# Patient Record
Sex: Female | Born: 1935 | Race: White | Hispanic: No | Marital: Married | Smoking: Never smoker
Health system: Southern US, Community
[De-identification: ages and names within clinical notes are randomized; demographics above are authoritative.]

## PROBLEM LIST (undated history)

## (undated) DIAGNOSIS — R32 Unspecified urinary incontinence: Secondary | ICD-10-CM

## (undated) DIAGNOSIS — IMO0001 Reserved for inherently not codable concepts without codable children: Secondary | ICD-10-CM

## (undated) DIAGNOSIS — I70229 Atherosclerosis of native arteries of extremities with rest pain, unspecified extremity: Secondary | ICD-10-CM

## (undated) DIAGNOSIS — R102 Pelvic and perineal pain: Secondary | ICD-10-CM

## (undated) DIAGNOSIS — S81802A Unspecified open wound, left lower leg, initial encounter: Secondary | ICD-10-CM

## (undated) DIAGNOSIS — M79605 Pain in left leg: Secondary | ICD-10-CM

## (undated) DIAGNOSIS — R1011 Right upper quadrant pain: Secondary | ICD-10-CM

## (undated) DIAGNOSIS — I10 Essential (primary) hypertension: Secondary | ICD-10-CM

## (undated) DIAGNOSIS — R030 Elevated blood-pressure reading, without diagnosis of hypertension: Secondary | ICD-10-CM

## (undated) DIAGNOSIS — B3789 Other sites of candidiasis: Secondary | ICD-10-CM

## (undated) DIAGNOSIS — N898 Other specified noninflammatory disorders of vagina: Secondary | ICD-10-CM

## (undated) DIAGNOSIS — S0990XA Unspecified injury of head, initial encounter: Secondary | ICD-10-CM

## (undated) DIAGNOSIS — E669 Obesity, unspecified: Secondary | ICD-10-CM

## (undated) DIAGNOSIS — Z9181 History of falling: Secondary | ICD-10-CM

## (undated) DIAGNOSIS — N952 Postmenopausal atrophic vaginitis: Secondary | ICD-10-CM

## (undated) DIAGNOSIS — M7120 Synovial cyst of popliteal space [Baker], unspecified knee: Secondary | ICD-10-CM

## (undated) DIAGNOSIS — M5136 Other intervertebral disc degeneration, lumbar region: Secondary | ICD-10-CM

## (undated) DIAGNOSIS — R42 Dizziness and giddiness: Secondary | ICD-10-CM

## (undated) DIAGNOSIS — N390 Urinary tract infection, site not specified: Secondary | ICD-10-CM

## (undated) DIAGNOSIS — R350 Frequency of micturition: Secondary | ICD-10-CM

## (undated) HISTORY — DX: Unspecified injury of head, initial encounter: S09.90XA

## (undated) HISTORY — DX: Other sites of candidiasis: B37.89

## (undated) HISTORY — DX: Right upper quadrant pain: R10.11

## (undated) HISTORY — PX: ABDOMINAL HYSTERECTOMY: SHX81

## (undated) HISTORY — DX: Dizziness and giddiness: R42

## (undated) HISTORY — DX: Atherosclerosis of native arteries of extremities with rest pain, unspecified extremity: I70.229

## (undated) HISTORY — DX: Other specified noninflammatory disorders of vagina: N89.8

## (undated) HISTORY — PX: APPENDECTOMY: SHX54

## (undated) HISTORY — DX: Pelvic and perineal pain: R10.2

## (undated) HISTORY — DX: Synovial cyst of popliteal space (Baker), unspecified knee: M71.20

## (undated) HISTORY — DX: Pain in left leg: M79.605

## (undated) HISTORY — PX: RECTOCELE REPAIR: SHX761

## (undated) HISTORY — DX: Elevated blood-pressure reading, without diagnosis of hypertension: R03.0

## (undated) HISTORY — DX: Other intervertebral disc degeneration, lumbar region: M51.36

## (undated) HISTORY — DX: Unspecified open wound, left lower leg, initial encounter: S81.802A

## (undated) HISTORY — DX: Obesity, unspecified: E66.9

## (undated) HISTORY — DX: Frequency of micturition: R35.0

## (undated) HISTORY — DX: Postmenopausal atrophic vaginitis: N95.2

## (undated) HISTORY — DX: Urinary tract infection, site not specified: N39.0

## (undated) HISTORY — PX: TONSILLECTOMY: SUR1361

## (undated) HISTORY — DX: History of falling: Z91.81

## (undated) HISTORY — PX: OTHER SURGICAL HISTORY: SHX169

## (undated) HISTORY — DX: Unspecified urinary incontinence: R32

## (undated) HISTORY — DX: Reserved for inherently not codable concepts without codable children: IMO0001

---

## 2004-08-05 ENCOUNTER — Ambulatory Visit: Payer: Self-pay | Admitting: Gastroenterology

## 2004-08-10 ENCOUNTER — Ambulatory Visit: Payer: Self-pay | Admitting: Gastroenterology

## 2006-11-20 ENCOUNTER — Ambulatory Visit: Payer: Self-pay | Admitting: Gastroenterology

## 2007-08-05 ENCOUNTER — Ambulatory Visit: Payer: Self-pay | Admitting: Specialist

## 2008-05-19 ENCOUNTER — Emergency Department: Payer: Self-pay | Admitting: Emergency Medicine

## 2008-09-15 ENCOUNTER — Ambulatory Visit: Payer: Self-pay | Admitting: Ophthalmology

## 2008-09-23 ENCOUNTER — Ambulatory Visit: Payer: Self-pay | Admitting: Ophthalmology

## 2008-10-26 ENCOUNTER — Ambulatory Visit: Payer: Self-pay | Admitting: Ophthalmology

## 2011-11-11 ENCOUNTER — Observation Stay: Payer: Self-pay | Admitting: Specialist

## 2011-11-11 LAB — TROPONIN I: Troponin-I: <0.02 ng/mL

## 2011-11-11 LAB — CBC
HCT: 41.5 % (ref 35.0–47.0)
HGB: 14 g/dL (ref 12.0–16.0)
MCHC: 33.7 g/dL (ref 32.0–36.0)
MCV: 92 fL (ref 80–100)
RBC: 4.49 10*6/uL (ref 3.80–5.20)
WBC: 7.4 10*3/uL (ref 3.6–11.0)

## 2011-11-11 LAB — PROTIME-INR
INR: 0.9
Prothrombin Time: 13 secs (ref 11.5–14.7)

## 2011-11-11 LAB — URINALYSIS, COMPLETE
Bilirubin,UR: NEGATIVE
Blood: NEGATIVE
Glucose,UR: NEGATIVE mg/dL (ref 0–75)
Protein: NEGATIVE
RBC,UR: 2 /HPF (ref 0–5)
Squamous Epithelial: 1
Transitional Epi: <1
WBC UR: 9 /HPF (ref 0–5)

## 2011-11-11 LAB — COMPREHENSIVE METABOLIC PANEL
Alkaline Phosphatase: 91 U/L (ref 50–136)
BUN: 12 mg/dL (ref 7–18)
Chloride: 105 mmol/L (ref 98–107)
Co2: 26 mmol/L (ref 21–32)
Creatinine: 1 mg/dL (ref 0.60–1.30)
EGFR (African American): 59 — ABNORMAL LOW
EGFR (Non-African Amer.): 51 — ABNORMAL LOW
SGOT(AST): 36 U/L (ref 15–37)
SGPT (ALT): 24 U/L (ref 12–78)
Total Protein: 7.2 g/dL (ref 6.4–8.2)

## 2011-11-12 LAB — LIPID PANEL
Cholesterol: 209 mg/dL — ABNORMAL HIGH (ref 0–200)
Ldl Cholesterol, Calc: 146 mg/dL — ABNORMAL HIGH (ref 0–100)

## 2012-11-12 ENCOUNTER — Emergency Department: Payer: Self-pay

## 2012-11-12 LAB — URINALYSIS, COMPLETE
Bilirubin,UR: NEGATIVE
Blood: NEGATIVE
Ketone: NEGATIVE
Leukocyte Esterase: NEGATIVE
Ph: 6 (ref 4.5–8.0)
RBC,UR: 2 /HPF (ref 0–5)
Squamous Epithelial: 1

## 2012-11-12 LAB — COMPREHENSIVE METABOLIC PANEL
Albumin: 4 g/dL (ref 3.4–5.0)
Alkaline Phosphatase: 94 U/L (ref 50–136)
BUN: 15 mg/dL (ref 7–18)
Bilirubin,Total: 0.3 mg/dL (ref 0.2–1.0)
Calcium, Total: 9 mg/dL (ref 8.5–10.1)
Chloride: 105 mmol/L (ref 98–107)
Creatinine: 1.26 mg/dL (ref 0.60–1.30)
EGFR (African American): 44 — ABNORMAL LOW
EGFR (Non-African Amer.): 38 — ABNORMAL LOW
Osmolality: 274 (ref 275–301)
Potassium: 3.7 mmol/L (ref 3.5–5.1)
SGOT(AST): 34 U/L (ref 15–37)
SGPT (ALT): 23 U/L (ref 12–78)
Sodium: 137 mmol/L (ref 136–145)
Total Protein: 7.3 g/dL (ref 6.4–8.2)

## 2012-11-12 LAB — CBC
HCT: 41.7 % (ref 35.0–47.0)
HGB: 14.1 g/dL (ref 12.0–16.0)
MCHC: 33.8 g/dL (ref 32.0–36.0)
MCV: 93 fL (ref 80–100)
RBC: 4.5 10*6/uL (ref 3.80–5.20)

## 2012-11-12 LAB — LIPASE, BLOOD: Lipase: 180 U/L (ref 73–393)

## 2013-02-21 ENCOUNTER — Ambulatory Visit: Payer: Self-pay | Admitting: Family Medicine

## 2013-04-24 ENCOUNTER — Ambulatory Visit: Payer: Self-pay | Admitting: Family Medicine

## 2013-04-25 ENCOUNTER — Emergency Department: Payer: Self-pay

## 2013-04-25 LAB — URINALYSIS, COMPLETE
Bacteria: NONE SEEN
Bilirubin,UR: NEGATIVE
Blood: NEGATIVE
Glucose,UR: NEGATIVE mg/dL (ref 0–75)
KETONE: NEGATIVE
Leukocyte Esterase: NEGATIVE
Nitrite: NEGATIVE
PH: 5 (ref 4.5–8.0)
PROTEIN: NEGATIVE
SPECIFIC GRAVITY: 1.027 (ref 1.003–1.030)
Squamous Epithelial: 1
WBC UR: 2 /HPF (ref 0–5)

## 2013-04-25 LAB — CBC WITH DIFFERENTIAL/PLATELET
BASOS PCT: 1 %
Basophil #: 0 10*3/uL (ref 0.0–0.1)
Eosinophil #: 0 10*3/uL (ref 0.0–0.7)
Eosinophil %: 0.1 %
HCT: 40.2 % (ref 35.0–47.0)
HGB: 13.7 g/dL (ref 12.0–16.0)
LYMPHS ABS: 1 10*3/uL (ref 1.0–3.6)
Lymphocyte %: 27.8 %
MCH: 31.9 pg (ref 26.0–34.0)
MCHC: 34 g/dL (ref 32.0–36.0)
MCV: 94 fL (ref 80–100)
MONO ABS: 0.8 x10 3/mm (ref 0.2–0.9)
Monocyte %: 23 %
NEUTROS PCT: 48.1 %
Neutrophil #: 1.7 10*3/uL (ref 1.4–6.5)
Platelet: 183 10*3/uL (ref 150–440)
RBC: 4.3 10*6/uL (ref 3.80–5.20)
RDW: 13.2 % (ref 11.5–14.5)
WBC: 3.6 10*3/uL (ref 3.6–11.0)

## 2013-04-25 LAB — LIPASE, BLOOD: Lipase: 277 U/L (ref 73–393)

## 2013-04-25 LAB — COMPREHENSIVE METABOLIC PANEL
ALBUMIN: 3.6 g/dL (ref 3.4–5.0)
ALK PHOS: 71 U/L
ANION GAP: 4 — AB (ref 7–16)
BUN: 20 mg/dL — ABNORMAL HIGH (ref 7–18)
Bilirubin,Total: 0.3 mg/dL (ref 0.2–1.0)
CHLORIDE: 102 mmol/L (ref 98–107)
CREATININE: 1.02 mg/dL (ref 0.60–1.30)
Calcium, Total: 8.8 mg/dL (ref 8.5–10.1)
Co2: 29 mmol/L (ref 21–32)
EGFR (Non-African Amer.): 49 — ABNORMAL LOW
GFR CALC AF AMER: 57 — AB
Glucose: 87 mg/dL (ref 65–99)
Osmolality: 272 (ref 275–301)
Potassium: 3.8 mmol/L (ref 3.5–5.1)
SGOT(AST): 24 U/L (ref 15–37)
SGPT (ALT): 22 U/L (ref 12–78)
SODIUM: 135 mmol/L — AB (ref 136–145)
TOTAL PROTEIN: 7 g/dL (ref 6.4–8.2)

## 2013-04-25 LAB — TROPONIN I

## 2013-09-02 ENCOUNTER — Encounter: Payer: Self-pay | Admitting: General Surgery

## 2013-09-02 ENCOUNTER — Ambulatory Visit: Payer: Self-pay | Admitting: Family Medicine

## 2013-09-18 ENCOUNTER — Ambulatory Visit: Payer: Self-pay | Admitting: General Surgery

## 2014-06-02 NOTE — Discharge Summary (Signed)
PATIENT NAME:  Tiffany Jenkins, Tiffany Jenkins MR#:  [id number] DATE OF BIRTH:  24-Mar-1923  DATE OF ADMISSION:  11/11/2011 DATE OF DISCHARGE:  11/12/2011  For a detailed note, please take a look at the history and physical done on admission by Dr. Imogene Burnhen.   DIAGNOSES AT DISCHARGE:  1. Syncope secondary to orthostatic hypotension.  2. Undiagnosed hypertension.  3. Hyperlipidemia.   DIET: The patient is being discharged on a low sodium, low fat diet.   ACTIVITY: As tolerated.   FOLLOW-UP: Follow-up with Dr. Lacie ScottsNiemeyer in the next 1 to 2 weeks.   DISCHARGE MEDICATIONS: Norvasc 5 mg daily.   PERTINENT STUDIES DONE DURING THE HOSPITAL COURSE: CT scan of the head done without contrast on admission showing no acute intracranial process.   An ultrasound of the carotids showed no evidence of any hemodynamically significant carotid stenosis.   A two-dimensional echocardiogram done showing normal LV function with EF 55% with mild mitral regurgitation and mild to moderate aortic regurgitation.   BRIEF HOSPITAL COURSE: This is an 81 year old female with no past medical history who presented to the hospital with a syncopal episode.  1. Syncope. The most likely cause of the patient'Jenkins syncope was likely orthostatic hypotension. She was noted to be positive for orthostasis in the Emergency Room. She has a history of undiagnosed hypertension and she is currently not taking any medications. She was observed overnight on telemetry and did not have any evidence of arrhythmia. Her CT of the head was negative. Her carotid duplex and echocardiogram also are essentially normal. She has been ambulated today, is asymptomatic, and has no evidence of orthostasis. She is, therefore, being discharged home with close follow-up with her primary care physician.  2. Hypertension. The patient probably has stage I hypertension undiagnosed and is currently not on any medications. She has had systolic blood pressures as high as 140'Jenkins to 150'Jenkins. I  am discharging her on low dose Norvasc as stated.  3. Hyperlipidemia. As per the patient and the family, she does have a history of hyperlipidemia. She had a lipid profile done at her primary care physician'Jenkins office about 3 to 4 weeks ago, although she wants to try lifestyle modifications before starting any pharmacologic treatment. This can be further followed up by her primary care physician.   TIME SPENT: 35 minutes.   ____________________________ Rolly PancakeVivek J. Cherlynn KaiserSainani, MD vjs:drc D: 11/12/2011 12:54:59 ET T: 11/13/2011 10:04:35 ET JOB#: [id number]  cc: Rolly PancakeVivek J. Cherlynn KaiserSainani, MD, <Dictator> Meindert A. Lacie ScottsNiemeyer, MD Houston SirenVIVEK J Athina Fahey MD ELECTRONICALLY SIGNED 11/14/2011 8:52

## 2014-06-02 NOTE — H&P (Signed)
PATIENT NAME:  Tiffany Jenkins, Tiffany Jenkins MR#:  [id number] DATE OF BIRTH:  [birth year]  DATE OF ADMISSION:  11/11/2011  PRIMARY CARE PHYSICIAN: Dr. Bethena MidgetMead   REFERRING PHYSICIAN: Dr. Clemens Catholicagsdale   CHIEF COMPLAINT: Passed out this afternoon.   HISTORY OF PRESENT ILLNESS: The patient is an 81 year old Caucasian female with a past history of UTI who presented to the ED with the above chief complaint. The patient is alert, awake, oriented in no acute distress. She said she passed out about several minutes at 3 p.m. while doing yard work. She denies any headache, dizziness, incontinence. No seizure. No slurred speech or dysphagia. The patient fell on the ground and hurt her right buttock. She denies any other symptoms.   PAST MEDICAL HISTORY: Urinary tract infection.   PAST SURGICAL HISTORY: None.   SOCIAL HISTORY: No smoking, drinking, or illicit drugs.   FAMILY HISTORY: None.   ALLERGIES: Codeine and sulfa drugs.  REVIEW OF SYSTEMS: CONSTITUTIONAL: The patient denies any fever, chills. No headache or dizziness. No weakness or weight loss. EYES: No double vision or blurred vision. ENT: No epistaxis, postnasal drip, or slurred speech. No dysphagia. CARDIOVASCULAR: No chest pain, palpitation, orthopnea, nocturnal dyspnea. No leg edema. PULMONARY: No cough, sputum, shortness of breath, or hemoptysis. GI: No abdominal pain, nausea, vomiting, or diarrhea. GU: No dysuria, hematuria, or incontinence. SKIN: No rash or jaundice. HEMATOLOGY: No easy bruising or bleeding. NEUROLOGY: Positive for syncope and loss of consciousness but no seizure.    PHYSICAL EXAMINATION:   VITAL SIGNS: Blood pressure 166/67, pulse 85, respirations 22, oxygen saturation 96% on room air.   GENERAL: The patient is alert, awake, oriented in no acute distress.   HEENT: Pupils are equal and reactive to light and accommodation. Moist oral mucosa. Clear oropharynx.   NECK: Supple. No JVD or carotid bruits. No lymphadenopathy. No thyromegaly.     CARDIOVASCULAR: S1, S2 regular rate, rhythm. No murmurs or gallops.   PULMONARY: Bilateral air entry. No wheezing or rales. No use of accessory muscles to breathe.   ABDOMEN: Soft. No distention or tenderness. No organomegaly. Bowel sounds present.   EXTREMITIES: No edema, clubbing, or cyanosis. No calf tenderness. Strong bilateral pedal pulses.   SKIN: No rash or jaundice.   NEUROLOGIC: Alert and oriented x3. No focal deficit. Power 5/5. Sensation intact. Deep tendon reflexes mute.   LABORATORY DATA: CBC normal. Urinalysis showed WBC 9, RBC 2. CK 292. CK-MB 4.8. Glucose 97, BUN 12, creatinine 1.0. Electrolytes normal. INR 0.9. Troponin less than 0.02. TSH 4.31.   Hip right complete x-ray no acute injury.   Pelvis x-ray no acute injury.   CAT scan of head no acute intracranial process.   EKG showed normal sinus rhythm at 80 beats per minute.   IMPRESSION:  1. Syncope, unknown etiology.  2. Hypertension.  3. Urinary tract infection.   PLAN OF TREATMENT:  1. The patient will be placed for observation.  2. We will get telemonitor, echocardiogram, and carotid duplex.  3. For mild urinary tract infection, we will start Rocephin. 4. The patient may be discharged to tomorrow if tests negative.  5. GI and DVT prophylaxis.   Discussed the patient'Jenkins situation and plan of treatment with the patient, the patient'Jenkins husband, and daughter.   TIME SPENT: About 50 minutes.   ____________________________ Shaune PollackQing Besse Miron, MD qc:drc D: 11/11/2011 20:54:02 ET T: 11/12/2011 08:34:18 ET JOB#: [id number]  cc: Shaune PollackQing Iza Preston, MD, <Dictator> Mickel Crowobert J. Mead, MD Shaune PollackQING Petula Rotolo MD ELECTRONICALLY SIGNED 11/13/2011 14:35

## 2014-06-03 ENCOUNTER — Ambulatory Visit
Admit: 2014-06-03 | Disposition: A | Discharge status: Home / Self Care | Payer: Self-pay | Attending: Urology | Admitting: Urology

## 2014-09-25 ENCOUNTER — Other Ambulatory Visit: Payer: Self-pay | Admitting: Family Medicine

## 2014-10-01 ENCOUNTER — Telehealth: Payer: Self-pay | Admitting: Family Medicine

## 2014-10-01 MED ORDER — HYDROCHLOROTHIAZIDE 12.5 MG PO TABS: 12.5000 mg | ORAL_TABLET | Freq: Every day | ORAL | Status: DC

## 2014-10-01 NOTE — Telephone Encounter (Signed)
Medication has been refilled and sent to [facility]

## 2015-01-04 ENCOUNTER — Telehealth: Payer: Self-pay | Admitting: Urology

## 2015-01-04 NOTE — Telephone Encounter (Signed)
Pt has been on Macrobid and has 9 refills left.  Pt and her daughter feel that this medicine is expensive and the pharmacist told them that there is a lesser priced drug available, named Ditropan, that she can take twice a day compared to the Macrobid once a day.  Please give the patient and her daughter a call at [phone].  Insurance does not cover Macrobid.

## 2015-01-04 NOTE — Telephone Encounter (Signed)
Please advise 

## 2015-01-05 NOTE — Telephone Encounter (Signed)
Macrobid is an antibiotic, so Ditropan is not a substitute for this medication.  This doesn't make sense.

## 2015-01-11 NOTE — Telephone Encounter (Signed)
No answer

## 2015-01-13 NOTE — Telephone Encounter (Signed)
Spoke with pt in reference to medications. Pt stated that her delivery driver from the pharmacy is who told her about the medications. Explained to pt those are 2 different medications and ditropan will not help with infections. Pt voiced understanding and stated she recently had an infection and had to see her PCP for help. Reinforced with pt the need to come see us due to that being our speciality. Pt stated she has a hard time getting into our clinic. Made pt aware that problem has been handled and should she develop another UTI to call us first and well try to get her in. Pt voiced understanding.

## 2015-06-21 ENCOUNTER — Encounter: Payer: Self-pay | Admitting: *Deleted

## 2015-06-23 ENCOUNTER — Ambulatory Visit (INDEPENDENT_AMBULATORY_CARE_PROVIDER_SITE_OTHER): Payer: Medicare Other | Admitting: Urology

## 2015-06-23 ENCOUNTER — Encounter: Payer: Self-pay | Admitting: Urology

## 2015-06-23 ENCOUNTER — Telehealth: Payer: Self-pay | Admitting: Urology

## 2015-06-23 VITALS — BP 154/76 | HR 93 | Ht 65.0 in | Wt 143.9 lb

## 2015-06-23 DIAGNOSIS — N952 Postmenopausal atrophic vaginitis: Secondary | ICD-10-CM | POA: Diagnosis not present

## 2015-06-23 DIAGNOSIS — R32 Unspecified urinary incontinence: Secondary | ICD-10-CM

## 2015-06-23 DIAGNOSIS — N390 Urinary tract infection, site not specified: Secondary | ICD-10-CM | POA: Diagnosis not present

## 2015-06-23 LAB — URINALYSIS, COMPLETE
Bilirubin, UA: NEGATIVE
Glucose, UA: NEGATIVE
NITRITE UA: POSITIVE — AB
Protein, UA: NEGATIVE
RBC, UA: NEGATIVE
SPEC GRAV UA: 1.02 (ref 1.005–1.030)
Urobilinogen, Ur: 0.2 mg/dL (ref 0.2–1.0)
pH, UA: 5.5 (ref 5.0–7.5)

## 2015-06-23 LAB — MICROSCOPIC EXAMINATION: RBC MICROSCOPIC, UA: NONE SEEN /HPF (ref 0–?)

## 2015-06-23 MED ORDER — ESTROGENS, CONJUGATED 0.625 MG/GM VA CREA: 1.0000 | TOPICAL_CREAM | Freq: Every day | VAGINAL | Status: DC

## 2015-06-23 MED ORDER — ESTRADIOL 0.1 MG/GM VA CREA: TOPICAL_CREAM | VAGINAL | Status: DC

## 2015-06-23 MED ORDER — NITROFURANTOIN MONOHYD MACRO 100 MG PO CAPS: 100.0000 mg | ORAL_CAPSULE | Freq: Two times a day (BID) | ORAL | Status: DC

## 2015-06-23 NOTE — Progress Notes (Signed)
In and Out Catheterization  Patient is present today for a I & O catheterization due to urinary tract infection. Patient was cleaned and prepped in a sterile fashion with betadine and Lidocaine 2% jelly was instilled into the urethra.  A 14FR cath was inserted no complications were noted , 250ml of urine return was noted, urine was yellow in color. A clean urine sample was collected for clean catch urinalysis. Bladder was drained and catheter was removed with out difficulty.    Preformed by: Dallas Schimkeamona Williams CMA

## 2015-06-23 NOTE — Telephone Encounter (Signed)
Would you call Dr. Fredna DowMasoud's office and see if they have documented infections over the last year?

## 2015-06-23 NOTE — Progress Notes (Signed)
06/23/2015 2:26 PM   Tiffany Jenkins 05/20/1923 [id number]  Referring provider: No referring provider defined for this encounter.  Chief Complaint  Patient presents with  . Urinary Tract Infection    patient thinks she has an uti    HPI: Patient is a 82 year old Caucasian female who presents today stating that she has a possible urinary tract infection.  She is also due for her annual visit.  She states that she was on a Z-Pak for 2 weeks for upper respiratory tract infection. She then started experiencing a vaginal yeast infection. She was prescribed a vaginal yeast cream by her primary care physician, Dr. Juel BurrowMasoud for 7 days.  She is still experiencing vaginal irritation and itching. She denies any vaginal discharge.  She is also having frequency of urination, urgency, dysuria and leakage of urine.  She is currently taking to Ditropan XL 15 mg daily for the urinary leakage and she states that it provides her significant reduction in incontinent episodes.  She is not had gross hematuria or suprapubic pain. She denies fevers, chills, nausea or vomiting.  Her cath UA specimen is nitrate positive.  I will be sending it for culture.  She had been on Myrbetriq 25 mg daily in the past, but it was not affordable.  She has been unable to come to our office when she is experiencing symptoms of urinary tract infections due to her inability to drive and dependent on transportation.  She does live close to her primary care physician's office and has been seeing them for her urinary tract infections.  She is unsure how many infection she has had over the last year.  She states that when she has symptoms of a UTI, the office will call in an antibiotic for her.  PMH: Past Medical History  Diagnosis Date  . Urinary tract infection   . Head injury   . DDD (degenerative disc disease), lumbar   . Urinary frequency   . Abdominal pain, right upper quadrant   . Vaginal itching   . Pain of  left lower extremity   . Candidiasis of breast   . At risk for falls   . Elevated BP   . Dizziness   . Atherosclerosis of native arteries of extremities with rest pain, unspecified extremity (HCC)   . Synovial cyst of popliteal space   . Wound of left leg   . Urinary incontinence   . Vaginal atrophy   . Obesity   . Pelvic pain in female     Surgical History: Past Surgical History  Procedure Laterality Date  . Appendectomy    . Rectocele repair    . Abdominal hysterectomy    . Tonsillectomy    . Bladder track      x 2    Home Medications:    Medication List       This list is accurate as of: 06/23/15  2:26 PM.  Always use your most recent med list.               amLODipine 5 MG tablet  Commonly known as:  NORVASC  Reported on 06/23/2015     azithromycin 250 MG tablet  Commonly known as:  ZITHROMAX  Reported on 06/23/2015     fluconazole 100 MG tablet  Commonly known as:  DIFLUCAN     fluorouracil 5 % cream  Commonly known as:  EFUDEX     hydrochlorothiazide 12.5 MG tablet  Commonly known as:  HYDRODIURIL  Take 1 tablet (12.5 mg total) by mouth daily.     oxybutynin 15 MG 24 hr tablet  Commonly known as:  DITROPAN XL        Allergies:  Allergies  Allergen Reactions  . Codeine   . Sulfa Antibiotics     Family History: Family History  Problem Relation Age of Onset  . Prostate cancer Son   . Kidney disease Mother   . Breast cancer Sister     Social History:  reports that she has never smoked. She does not have any smokeless tobacco history on file. She reports that she does not drink alcohol or use illicit drugs.  ROS: UROLOGY Frequent Urination?: Yes Hard to postpone urination?: Yes Burning/pain with urination?: Yes Get up at night to urinate?: No Leakage of urine?: Yes Urine stream starts and stops?: No Trouble starting stream?: No Do you have to strain to urinate?: No Blood in urine?: No Urinary tract infection?: Yes Sexually  transmitted disease?: No Injury to kidneys or bladder?: No Painful intercourse?: No Weak stream?: No Currently pregnant?: No Vaginal bleeding?: No Last menstrual period?: n  Gastrointestinal Nausea?: No Vomiting?: No Indigestion/heartburn?: No Diarrhea?: No Constipation?: No  Constitutional Fever: No Night sweats?: No Weight loss?: No Fatigue?: No  Skin Skin rash/lesions?: Yes Itching?: Yes  Eyes Blurred vision?: No Double vision?: No  Ears/Nose/Throat Sore throat?: No Sinus problems?: No  Hematologic/Lymphatic Swollen glands?: Yes Easy bruising?: Yes  Cardiovascular Leg swelling?: Yes Chest pain?: No  Respiratory Cough?: No Shortness of breath?: No  Endocrine Excessive thirst?: No  Musculoskeletal Back pain?: No Joint pain?: No  Neurological Headaches?: No Dizziness?: No  Psychologic Depression?: No Anxiety?: No  Physical Exam: BP 154/76 mmHg  Pulse 93  Ht 5\' 5"  (1.651 m)  Wt 143 lb 14.4 oz (65.273 kg)  BMI 23.95 kg/m2  Constitutional: Well nourished. Alert and oriented, No acute distress. HEENT: [place] AT, moist mucus membranes. Trachea midline, no masses. Cardiovascular: No clubbing, cyanosis, or edema. Respiratory: Normal respiratory effort, no increased work of breathing. GI: Abdomen is soft, non tender, non distended, no abdominal masses. Liver and spleen not palpable.  No hernias appreciated.  Stool sample for occult testing is not indicated.   GU: No CVA tenderness.  No bladder fullness or masses.  Atrophic external genitalia, normal pubic hair distribution, no lesions.  Normal urethral meatus, no lesions, no prolapse, no discharge.   Urethral caruncle is noted.   No bladder fullness, tenderness or masses. Normal vagina mucosa, good estrogen effect, no discharge, no lesions, good pelvic support, Grade II cystocele is noted.  No rectocele is noted.  Cervix, uterus and adnexa are surgically absent.  Anus and perineum are without rashes or  lesions.    Skin: No rashes, bruises or suspicious lesions. Lymph: No cervical or inguinal adenopathy. Neurologic: Grossly intact, no focal deficits, moving all 4 extremities. Psychiatric: Normal mood and affect.  Laboratory Data: Lab Results  Component Value Date   WBC 3.6 04/25/2013   HGB 13.7 04/25/2013   HCT 40.2 04/25/2013   MCV 94 04/25/2013   PLT 183 04/25/2013    Lab Results  Component Value Date   CREATININE 1.02 04/25/2013    Lab Results  Component Value Date   TSH 4.31 11/11/2011       Component Value Date/Time   CHOL 209* 11/12/2011 0411   HDL 36* 11/12/2011 0411   VLDL 27 11/12/2011 0411   LDLCALC 146* 11/12/2011 0411    Lab Results  Component Value  Date   AST 24 04/25/2013   Lab Results  Component Value Date   ALT 22 04/25/2013    Urinalysis Results for orders placed or performed in visit on 06/23/15  CULTURE, URINE COMPREHENSIVE  Result Value Ref Range   Urine Culture, Comprehensive Final report (A)    Result 1 Escherichia coli (A)    ANTIMICROBIAL SUSCEPTIBILITY Comment   Microscopic Examination  Result Value Ref Range   WBC, UA 11-30 (A) 0 -  5 /hpf   RBC, UA None seen 0 -  2 /hpf   Epithelial Cells (non renal) 0-10 0 - 10 /hpf   Bacteria, UA Many (A) None seen/Few  Urinalysis, Complete  Result Value Ref Range   Specific Gravity, UA 1.020 1.005 - 1.030   pH, UA 5.5 5.0 - 7.5   Color, UA Yellow Yellow   Appearance Ur Cloudy (A) Clear   Leukocytes, UA 1+ (A) Negative   Protein, UA Negative Negative/Trace   Glucose, UA Negative Negative   Ketones, UA Trace (A) Negative   RBC, UA Negative Negative   Bilirubin, UA Negative Negative   Urobilinogen, Ur 0.2 0.2 - 1.0 mg/dL   Nitrite, UA Positive (A) Negative   Microscopic Examination See below:      Assessment & Plan:    1. Recurrent UTI:   I do not have any recent documented urinary tract infections. We will contact Dr. Hollice Gong office for those records. Her UA today is suspicious  for a urinary tract infection, so I sent it for culture. I have empirically started her on Macrobid 100 mg twice daily for 7 days.  - Urinalysis, Complete - Culture, Urine comprehensive  2. Atrophic vaginitis:   Patient was given a sample of vaginal estrogen cream and instructed to apply 0.5mg  (pea-sized amount)  just inside the vaginal introitus with a finger-tip every night for two weeks and then Monday, Wednesday and Friday nights.  I explained to the patient that vaginally administered estrogen, which causes only a slight increase in the blood estrogen levels, have fewer contraindications and adverse systemic effects that oral HT.  3. Incontinence:   She found Myrbetriq cost prohibitive. She is currently on Ditropan XL 15 mg daily with good effect. She will continue that medication as prescribed by her primary care physician.    Return in about 2 weeks (around 07/07/2015) for UA and exam.  These notes generated with voice recognition software. I apologize for typographical errors.  Michiel Cowboy, PA-C  [facility] [address] [place], [place] [address] [phone]

## 2015-06-25 ENCOUNTER — Telehealth: Payer: Self-pay

## 2015-06-25 DIAGNOSIS — N390 Urinary tract infection, site not specified: Secondary | ICD-10-CM

## 2015-06-25 LAB — CULTURE, URINE COMPREHENSIVE

## 2015-06-25 MED ORDER — NITROFURANTOIN MONOHYD MACRO 100 MG PO CAPS: 100.0000 mg | ORAL_CAPSULE | Freq: Two times a day (BID) | ORAL | Status: DC

## 2015-06-25 NOTE — Telephone Encounter (Signed)
Spoke with pt in reference to +ucx. Made aware to continue macrobid. Pt voiced understanding.

## 2015-06-25 NOTE — Telephone Encounter (Signed)
-----   Message from Harle BattiestShannon A McGowan, PA-C sent at 06/25/2015  3:15 PM EDT ----- Patient does have a positive urine culture. She needs to continue the Macrobid antibiotic. I will see her in 2 weeks.

## 2015-06-27 DIAGNOSIS — R32 Unspecified urinary incontinence: Secondary | ICD-10-CM | POA: Insufficient documentation

## 2015-06-27 DIAGNOSIS — N952 Postmenopausal atrophic vaginitis: Secondary | ICD-10-CM | POA: Insufficient documentation

## 2015-06-27 DIAGNOSIS — N390 Urinary tract infection, site not specified: Secondary | ICD-10-CM | POA: Insufficient documentation

## 2015-06-30 NOTE — Telephone Encounter (Signed)
LMOM for Tiffany Jenkins, Dr. Tilda FrancoMasouds nurse

## 2015-07-01 NOTE — Telephone Encounter (Signed)
Spoke with Morrie SheldonAshley at Dr. Fredna DowMasoud's office. Per Morrie SheldonAshley pt has only been seeing them for a year now and was only dx with a UTI twice. Requested [place] fax all records from the past year. Morrie Sheldonshley stated she would fax them.

## 2015-07-06 ENCOUNTER — Other Ambulatory Visit: Payer: Self-pay | Admitting: Unknown Physician Specialty

## 2015-07-06 DIAGNOSIS — R1310 Dysphagia, unspecified: Secondary | ICD-10-CM

## 2015-07-07 ENCOUNTER — Ambulatory Visit (INDEPENDENT_AMBULATORY_CARE_PROVIDER_SITE_OTHER): Payer: Medicare Other | Admitting: Urology

## 2015-07-07 ENCOUNTER — Encounter: Payer: Self-pay | Admitting: Urology

## 2015-07-07 VITALS — BP 177/74 | HR 73 | Ht 65.0 in | Wt 146.6 lb

## 2015-07-07 DIAGNOSIS — R32 Unspecified urinary incontinence: Secondary | ICD-10-CM | POA: Diagnosis not present

## 2015-07-07 DIAGNOSIS — N390 Urinary tract infection, site not specified: Secondary | ICD-10-CM | POA: Diagnosis not present

## 2015-07-07 DIAGNOSIS — N952 Postmenopausal atrophic vaginitis: Secondary | ICD-10-CM

## 2015-07-07 LAB — URINALYSIS, COMPLETE
Bilirubin, UA: NEGATIVE
GLUCOSE, UA: NEGATIVE
Ketones, UA: NEGATIVE
Leukocytes, UA: NEGATIVE
Nitrite, UA: NEGATIVE
Protein, UA: NEGATIVE
Specific Gravity, UA: 1.01 (ref 1.005–1.030)
UUROB: 0.2 mg/dL (ref 0.2–1.0)
pH, UA: 6.5 (ref 5.0–7.5)

## 2015-07-07 LAB — MICROSCOPIC EXAMINATION: Bacteria, UA: NONE SEEN

## 2015-07-07 NOTE — Progress Notes (Signed)
In and Out Catheterization  Patient is present today for a I & O catheterization due to recheck on uti. Patient was cleaned and prepped in a sterile fashion with betadine and Lidocaine 2% jelly was instilled into the urethra.  A 14FR cath was inserted no complications were noted , 250ml of urine return was noted, urine was clear and yellow in color. A clean urine sample was collected for clean catch urinalysis and culture. Bladder was drained and catheter was removed with out difficulty.    Preformed by: Dallas Schimkeamona Izzabella Besse CMA

## 2015-07-07 NOTE — Progress Notes (Signed)
4:25 PM   Tiffany Jenkins [birth year] [id number]  Referring provider: Corky Downs, MD [address] [place], [place] [address]  Chief Complaint  Patient presents with  . Recurrent UTI    cath specimen follow up    HPI: Patient is a 82 year old Caucasian female with atrophic vaginitis, incontinence and a history of recurrent UTI who presents today for 2 week follow-up.  Background history Patient presented two weeks ago stating that she has a possible urinary tract infection.  She was also due for her annual visit.  She stated that she was on a Z-Pak for 2 weeks for upper respiratory tract infection. She then started experiencing a vaginal yeast infection. She was prescribed a vaginal yeast cream by her primary care physician, Dr. Juel Burrow for 7 days.  She was still experiencing vaginal irritation and itching. She denies any vaginal discharge.  She was also having frequency of urination, urgency, dysuria and leakage of urine.  She is currently taking to Ditropan XL 15 mg daily for the urinary leakage and she states that it provides her significant reduction in incontinent episodes.  Her cath UA specimen is nitrate positive.  Her urine culture was positive for E. Coli.  We prescribed her Macrobid and she completed a seven day course.  She had been on Myrbetriq 25 mg daily in the past, but it was not affordable.  She has been unable to come to our office when she is experiencing symptoms of urinary tract infections due to her inability to drive and dependent on transportation.  She does live close to her primary care physician's office and has been seeing them for her urinary tract infections.  She is unsure how many infection she has had over the last year.  She states that when she has symptoms of a UTI, the office will call in an antibiotic for her.  Today, she is experiencing frequency of urination, dysuria, nocturia and leakage of urine.  She was cathed for an UA today.  Her PVR is  250 mL.  Her UA is unremarkable.    She is using her vaginal estrogen cream.    PMH: Past Medical History  Diagnosis Date  . Urinary tract infection   . Head injury   . DDD (degenerative disc disease), lumbar   . Urinary frequency   . Abdominal pain, right upper quadrant   . Vaginal itching   . Pain of left lower extremity   . Candidiasis of breast   . At risk for falls   . Elevated BP   . Dizziness   . Atherosclerosis of native arteries of extremities with rest pain, unspecified extremity (HCC)   . Synovial cyst of popliteal space   . Wound of left leg   . Urinary incontinence   . Vaginal atrophy   . Obesity   . Pelvic pain in female     Surgical History: Past Surgical History  Procedure Laterality Date  . Appendectomy    . Rectocele repair    . Abdominal hysterectomy    . Tonsillectomy    . Bladder track      x 2    Home Medications:        Medication List       This list is accurate as of: 07/07/15  4:25 PM.  Always use your most recent med list.               amLODipine 5 MG tablet  Commonly known as:  NORVASC  Reported on 07/07/2015     azithromycin 250 MG tablet  Commonly known as:  ZITHROMAX  Reported on 07/07/2015     conjugated estrogens vaginal cream  Commonly known as:  PREMARIN  Place 1 Applicatorful vaginally daily. Apply 0.5mg  (pea-sized amount)  just inside the vaginal introitus with a finger-tip every night for two weeks and then Monday, Wednesday and Friday nights.     estradiol 0.1 MG/GM vaginal cream  Commonly known as:  ESTRACE VAGINAL  Apply 0.5mg  (pea-sized amount)  just inside the vaginal introitus with a finger-tip every night for two weeks and then Monday, Wednesday and Friday nights.     fluconazole 100 MG tablet  Commonly known as:  DIFLUCAN  Reported on 07/07/2015     fluorouracil 5 % cream  Commonly known as:  EFUDEX  Reported on 07/07/2015     hydrochlorothiazide 12.5 MG tablet  Commonly known as:  HYDRODIURIL  Take  1 tablet (12.5 mg total) by mouth daily.     nitrofurantoin (macrocrystal-monohydrate) 100 MG capsule  Commonly known as:  MACROBID  Take 1 capsule (100 mg total) by mouth every 12 (twelve) hours.     oxybutynin 15 MG 24 hr tablet  Commonly known as:  DITROPAN XL  Reported on 07/07/2015        Allergies:  Allergies  Allergen Reactions  . Codeine   . Sulfa Antibiotics     Family History: Family History  Problem Relation Age of Onset  . Prostate cancer Son   . Kidney disease Mother   . Breast cancer Sister     Social History:  reports that she has never smoked. She does not have any smokeless tobacco history on file. She reports that she does not drink alcohol or use illicit drugs.  ROS: UROLOGY Frequent Urination?: Yes Hard to postpone urination?: No Burning/pain with urination?: Yes Get up at night to urinate?: Yes Leakage of urine?: Yes Urine stream starts and stops?: No Trouble starting stream?: No Do you have to strain to urinate?: No Blood in urine?: No Urinary tract infection?: Yes Sexually transmitted disease?: No Injury to kidneys or bladder?: No Painful intercourse?: No Weak stream?: No Currently pregnant?: No Vaginal bleeding?: No Last menstrual period?: n  Gastrointestinal Nausea?: No Vomiting?: No Indigestion/heartburn?: No Diarrhea?: No Constipation?: No  Constitutional Fever: No Night sweats?: No Weight loss?: No Fatigue?: No  Skin Skin rash/lesions?: No Itching?: No  Eyes Blurred vision?: No Double vision?: No  Ears/Nose/Throat Sore throat?: No Sinus problems?: No  Hematologic/Lymphatic Swollen glands?: Yes Easy bruising?: Yes  Cardiovascular Leg swelling?: Yes Chest pain?: No  Respiratory Cough?: No Shortness of breath?: No  Endocrine Excessive thirst?: No  Musculoskeletal Back pain?: No Joint pain?: No  Neurological Headaches?: No Dizziness?: No  Psychologic Depression?: No Anxiety?: No  Physical  Exam: BP 177/74 mmHg  Pulse 73  Ht  (1.651 m)  Wt 146 lb 9.6 oz (66.497 kg)  BMI 24.40 kg/m2  Constitutional: Well nourished. Alert and oriented, No acute distress. HEENT: [place] AT, moist mucus membranes. Trachea midline, no masses. Cardiovascular: No clubbing, cyanosis, or edema. Respiratory: Normal respiratory effort, no increased work of breathing. GI: Abdomen is soft, non tender, non distended, no abdominal masses. Liver and spleen not palpable.  No hernias appreciated.  Stool sample for occult testing is not indicated.   GU: No CVA tenderness.  No bladder fullness or masses.  Atrophic external genitalia, normal pubic hair distribution, no lesions.  Normal urethral meatus, no  lesions, no prolapse, no discharge.   Urethral caruncle is noted.   No bladder fullness, tenderness or masses. Normal vagina mucosa, good estrogen effect, no discharge, no lesions, good pelvic support, Grade II cystocele is noted.  No rectocele is noted.  Cervix, uterus and adnexa are surgically absent.  Anus and perineum are without rashes or lesions.    Skin: No rashes, bruises or suspicious lesions. Lymph: No cervical or inguinal adenopathy. Neurologic: Grossly intact, no focal deficits, moving all 4 extremities. Psychiatric: Normal mood and affect.  Laboratory Data: Lab Results  Component Value Date   WBC 3.6 04/25/2013   HGB 13.7 04/25/2013   HCT 40.2 04/25/2013   MCV 94 04/25/2013   PLT 183 04/25/2013    Lab Results  Component Value Date   CREATININE 1.02 04/25/2013    Lab Results  Component Value Date   TSH 4.31 11/11/2011       Component Value Date/Time   CHOL 209* 11/12/2011 0411   HDL 36* 11/12/2011 0411   VLDL 27 11/12/2011 0411   LDLCALC 146* 11/12/2011 0411    Lab Results  Component Value Date   AST 24 04/25/2013   Lab Results  Component Value Date   ALT 22 04/25/2013    Urinalysis Results for orders placed or performed in visit on 07/07/15  CULTURE, URINE  COMPREHENSIVE  Result Value Ref Range   Urine Culture, Comprehensive Final report    Result 1 Comment   Microscopic Examination  Result Value Ref Range   WBC, UA 0-5 0 -  5 /hpf   RBC, UA 0-2 0 -  2 /hpf   Epithelial Cells (non renal) 0-10 0 - 10 /hpf   Bacteria, UA None seen None seen/Few  Urinalysis, Complete  Result Value Ref Range   Specific Gravity, UA 1.010 1.005 - 1.030   pH, UA 6.5 5.0 - 7.5   Color, UA Yellow Yellow   Appearance Ur Clear Clear   Leukocytes, UA Negative Negative   Protein, UA Negative Negative/Trace   Glucose, UA Negative Negative   Ketones, UA Negative Negative   RBC, UA Trace (A) Negative   Bilirubin, UA Negative Negative   Urobilinogen, Ur 0.2 0.2 - 1.0 mg/dL   Nitrite, UA Negative Negative   Microscopic Examination See below:      Assessment & Plan:    1. Recurrent UTI:   She was treated with a seven day course of Macrobid and is still having irritative voiding symptoms.  Her CATH UA is unremarkable.  I will send it for culture to ensure resolution of the infection.  If it returns negative,  I will schedule the patient for cystoscopy to rule out CIS.    - Urinalysis, Complete - Culture, Urine comprehensive  2. Atrophic vaginitis:   Patient will continue the vaginal estrogen cream, applying it 3 nights weekly. She will follow-up in 3 months for exam and symptom recheck.   3. Incontinence:   She found Myrbetriq cost prohibitive. She is currently on Ditropan XL 15 mg daily with good effect.  She will continue that medication as prescribed by her primary care physician.    Return for cystoscopy to rule out CIS.  These notes generated with voice recognition software. I apologize for typographical errors.  Kealey Kemmer, PA-C

## 2015-07-09 LAB — CULTURE, URINE COMPREHENSIVE

## 2015-07-13 ENCOUNTER — Ambulatory Visit
Admission: RE | Admit: 2015-07-13 | Discharge: 2015-07-13 | Disposition: A | Discharge status: Home / Self Care | Payer: Medicare Other | Source: Ambulatory Visit | Attending: Unknown Physician Specialty | Admitting: Unknown Physician Specialty

## 2015-07-13 ENCOUNTER — Telehealth: Payer: Self-pay

## 2015-07-13 DIAGNOSIS — J398 Other specified diseases of upper respiratory tract: Secondary | ICD-10-CM | POA: Diagnosis not present

## 2015-07-13 DIAGNOSIS — K219 Gastro-esophageal reflux disease without esophagitis: Secondary | ICD-10-CM | POA: Diagnosis not present

## 2015-07-13 DIAGNOSIS — K449 Diaphragmatic hernia without obstruction or gangrene: Secondary | ICD-10-CM | POA: Insufficient documentation

## 2015-07-13 DIAGNOSIS — R1310 Dysphagia, unspecified: Secondary | ICD-10-CM | POA: Insufficient documentation

## 2015-07-13 NOTE — Telephone Encounter (Signed)
-----   Message from Harle BattiestShannon A McGowan, PA-C sent at 07/10/2015  4:53 PM EDT ----- Please notify the patient that their urine culture is negative.

## 2015-07-13 NOTE — Telephone Encounter (Signed)
LMOM-recent labs are negative. 

## 2015-07-19 ENCOUNTER — Other Ambulatory Visit (HOSPITAL_COMMUNITY): Payer: Self-pay | Admitting: Unknown Physician Specialty

## 2015-07-19 ENCOUNTER — Other Ambulatory Visit: Payer: Self-pay | Admitting: Unknown Physician Specialty

## 2015-07-19 DIAGNOSIS — R1310 Dysphagia, unspecified: Secondary | ICD-10-CM

## 2015-07-27 ENCOUNTER — Ambulatory Visit
Admission: RE | Admit: 2015-07-27 | Discharge: 2015-07-27 | Disposition: A | Discharge status: Home / Self Care | Payer: Medicare Other | Source: Ambulatory Visit | Attending: Unknown Physician Specialty | Admitting: Unknown Physician Specialty

## 2015-07-27 DIAGNOSIS — R1310 Dysphagia, unspecified: Secondary | ICD-10-CM

## 2015-07-29 ENCOUNTER — Encounter: Payer: Self-pay | Admitting: *Deleted

## 2015-07-29 ENCOUNTER — Encounter
Admission: RE | Disposition: A | Discharge status: Home / Self Care | Payer: Self-pay | Source: Ambulatory Visit | Attending: Unknown Physician Specialty

## 2015-07-29 ENCOUNTER — Ambulatory Visit: Payer: Medicare Other | Admitting: Anesthesiology

## 2015-07-29 ENCOUNTER — Ambulatory Visit
Admission: RE | Admit: 2015-07-29 | Discharge: 2015-07-29 | Disposition: A | Discharge status: Home / Self Care | Payer: Medicare Other | Source: Ambulatory Visit | Attending: Unknown Physician Specialty | Admitting: Unknown Physician Specialty

## 2015-07-29 ENCOUNTER — Other Ambulatory Visit: Payer: Self-pay | Admitting: Student

## 2015-07-29 DIAGNOSIS — J398 Other specified diseases of upper respiratory tract: Secondary | ICD-10-CM | POA: Insufficient documentation

## 2015-07-29 DIAGNOSIS — I495 Sick sinus syndrome: Secondary | ICD-10-CM | POA: Diagnosis not present

## 2015-07-29 DIAGNOSIS — Z5309 Procedure and treatment not carried out because of other contraindication: Secondary | ICD-10-CM | POA: Insufficient documentation

## 2015-07-29 DIAGNOSIS — E669 Obesity, unspecified: Secondary | ICD-10-CM | POA: Insufficient documentation

## 2015-07-29 DIAGNOSIS — Z841 Family history of disorders of kidney and ureter: Secondary | ICD-10-CM | POA: Diagnosis not present

## 2015-07-29 DIAGNOSIS — M5136 Other intervertebral disc degeneration, lumbar region: Secondary | ICD-10-CM | POA: Insufficient documentation

## 2015-07-29 DIAGNOSIS — Z8042 Family history of malignant neoplasm of prostate: Secondary | ICD-10-CM | POA: Diagnosis not present

## 2015-07-29 DIAGNOSIS — R000 Tachycardia, unspecified: Secondary | ICD-10-CM | POA: Insufficient documentation

## 2015-07-29 DIAGNOSIS — Z90710 Acquired absence of both cervix and uterus: Secondary | ICD-10-CM | POA: Insufficient documentation

## 2015-07-29 DIAGNOSIS — Z6827 Body mass index (BMI) 27.0-27.9, adult: Secondary | ICD-10-CM | POA: Diagnosis not present

## 2015-07-29 DIAGNOSIS — Z882 Allergy status to sulfonamides status: Secondary | ICD-10-CM | POA: Diagnosis not present

## 2015-07-29 DIAGNOSIS — M1990 Unspecified osteoarthritis, unspecified site: Secondary | ICD-10-CM | POA: Insufficient documentation

## 2015-07-29 DIAGNOSIS — R1310 Dysphagia, unspecified: Secondary | ICD-10-CM

## 2015-07-29 DIAGNOSIS — Z885 Allergy status to narcotic agent status: Secondary | ICD-10-CM | POA: Insufficient documentation

## 2015-07-29 DIAGNOSIS — Z803 Family history of malignant neoplasm of breast: Secondary | ICD-10-CM | POA: Diagnosis not present

## 2015-07-29 DIAGNOSIS — Z880 Allergy status to penicillin: Secondary | ICD-10-CM | POA: Diagnosis not present

## 2015-07-29 DIAGNOSIS — I10 Essential (primary) hypertension: Secondary | ICD-10-CM | POA: Insufficient documentation

## 2015-07-29 HISTORY — DX: Essential (primary) hypertension: I10

## 2015-07-29 HISTORY — PX: ESOPHAGOGASTRODUODENOSCOPY (EGD) WITH PROPOFOL: SHX5813

## 2015-07-29 HISTORY — PX: BALLOON DILATION: SHX5330

## 2015-07-29 SURGERY — ESOPHAGOGASTRODUODENOSCOPY (EGD) WITH PROPOFOL
Anesthesia: General

## 2015-07-29 MED ORDER — SODIUM CHLORIDE 0.9 % IV SOLN: INTRAVENOUS | Status: DC

## 2015-07-29 MED ORDER — SODIUM CHLORIDE 0.9 % IV SOLN
INTRAVENOUS | Status: DC
Start: 2015-07-29 — End: 2015-07-29
  Administered 2015-07-29: 1000 mL via INTRAVENOUS

## 2015-07-29 MED ORDER — NITROGLYCERIN 2 % TD OINT
1.0000 [in_us] | TOPICAL_OINTMENT | TRANSDERMAL | Status: AC
  Administered 2015-07-29: 1 [in_us] via TOPICAL
  Filled 2015-07-29: qty 1

## 2015-07-29 MED ORDER — METOPROLOL TARTRATE 5 MG/5ML IV SOLN
INTRAVENOUS | Status: DC | PRN
  Administered 2015-07-29: 5 mg via INTRAVENOUS

## 2015-07-29 NOTE — Consult Note (Signed)
Reason for Consult:chest pain tachycardia Referring Physician: Dr. Noralyn Pick anesthesiologist Dr. Mechele Collin Dr. Leonia Reader Tiffany Jenkins is an 82 y.o. female.  HPI: 46-year-old female presents for EGD because of potential esophageal problems she's on minimal medication was done reasonably well but before the procedure began she was found to be tachycardic with a heart rate of 130 appeared to be regular possibly sinusitis versus flutter. EKG was obtained during the tachycardia. The patient complained of some chest discomfort during the episode. Symptoms improved slowly once her heart rate returned to normal. Patient denies any blackout spells or syncope. She denies any previous recent cardiac history. Cardiology consultation was recommended for preoperative clearance for EGD.  Past Medical History  Diagnosis Date  . Urinary tract infection   . Head injury   . DDD (degenerative disc disease), lumbar   . Urinary frequency   . Abdominal pain, right upper quadrant   . Vaginal itching   . Pain of left lower extremity   . Candidiasis of breast   . At risk for falls   . Elevated BP   . Dizziness   . Atherosclerosis of native arteries of extremities with rest pain, unspecified extremity (HCC)   . Synovial cyst of popliteal space   . Wound of left leg   . Urinary incontinence   . Vaginal atrophy   . Obesity   . Pelvic pain in female   . Hypertension     Past Surgical History  Procedure Laterality Date  . Appendectomy    . Rectocele repair    . Abdominal hysterectomy    . Tonsillectomy    . Bladder track      x 2    Family History  Problem Relation Age of Onset  . Prostate cancer Son   . Kidney disease Mother   . Breast cancer Sister     Social History:  reports that she has never smoked. She does not have any smokeless tobacco history on file. She reports that she does not drink alcohol or use illicit drugs.  Allergies:  Allergies  Allergen Reactions  . Codeine   .  Penicillins Hives  . Sulfa Antibiotics     Medications: I have reviewed the patient's current medications.  No results found for this or any previous visit (from the past 48 hour(s)).  US Thyroid  07/27/2015  CLINICAL DATA:  Dysphagia, tracheal deviation EXAM: THYROID ULTRASOUND TECHNIQUE: Ultrasound examination of the thyroid gland and adjacent soft tissues was performed. COMPARISON:  None. FINDINGS: Right thyroid lobe Measurements: 5.1 x 1.5 x 1.8 cm. Heterogeneous echotexture. 1.2 x 1.1 x 1.1 cm mostly solid nodule, right mid lobe. More inferiorly, a 0.8 cm colloid cyst. Somewhat poorly marginated 1.2 x1.5 x 0.6 cm solid nodule, superior pole. Left thyroid lobe Measurements: 4.5 x 0.9 x 1.5 cm. 0.4 cm colloid cyst, mid lobe. 0.9 x 0.6 x 0.5 cm complex nodule, inferior pole. Isthmus Thickness: 0.2 cm.  No nodules visualized. Lymphadenopathy None visualized. IMPRESSION: 1. Borderline thyromegaly with bilateral nodules. Findings do not meet current consensus criteria for biopsy. Follow-up by clinical exam is recommended. If patient has known risk factors for thyroid carcinoma, consider follow-up ultrasound in 12 months. If patient is clinically hyperthyroid, consider nuclear medicine thyroid uptake and scan. This recommendation follows the consensus statement: Management of Thyroid Nodules Detected as [place]: Society of Radiologists in Ultrasound Consensus Conference Statement. Radiology 2005; [id number]. Electronically Signed   By: Corlis Leak M.D.   On: 07/27/2015 17:55  Review of Systems  Constitutional: Positive for malaise/fatigue.  HENT: Positive for congestion.   Eyes: Negative.   Respiratory: Negative.   Cardiovascular: Positive for chest pain and palpitations.  Gastrointestinal: Positive for heartburn and nausea.  Genitourinary: Negative.   Musculoskeletal: Negative.   Neurological: Positive for weakness.  Psychiatric/Behavioral: Negative.    Blood pressure 150/62, pulse 73,  temperature 97.8 F (36.6 C), temperature source Tympanic, resp. rate 25, height 5\' 1"  (1.549 m), weight 67.132 kg (148 lb), SpO2 99 %. Physical Exam  Constitutional: She is oriented to person, place, and time. She appears well-developed and well-nourished.  HENT:  Head: Normocephalic and atraumatic.  Eyes: Conjunctivae and EOM are normal. Pupils are equal, round, and reactive to light.  Neck: Normal range of motion. Neck supple.  Cardiovascular: Normal rate.   Murmur heard. Respiratory: Effort normal and breath sounds normal.  GI: Soft. Bowel sounds are normal.  Musculoskeletal: Normal range of motion.  Neurological: She is alert and oriented to person, place, and time. She has normal reflexes.  Skin: Skin is warm.  Psychiatric: She has a normal mood and affect.    Assessment/Plan: Chest pain possible angina Tachycardia hypertension Sick sinus syndrome Pre-op EGD murmur . PLAN Consider echocardiogram [facility] Start low-dose beta blocker metoprolol 12.5 once a day Continue low-dose aspirin therapy Continue HCTZ for hypertension Have the patient follow-up in the office History of blood pressure and heart rate improved on metoprolol) the patient to have EGD Do not recommend invasive evaluation at this point Have the patient follow-up in the office if heart rate and blood pressure okay will call the patient and proceed with the procedure  CALLWOOD,DWAYNE D. 07/29/2015, 1:07 PM

## 2015-07-29 NOTE — Transfer of Care (Signed)
Immediate Anesthesia Transfer of Care Note  Patient: Tiffany Jenkins  Procedure(s) Performed: Procedure(s): ESOPHAGOGASTRODUODENOSCOPY (EGD) WITH PROPOFOL (N/A) BALLOON DILATION (N/A)  Patient Location: PACU  Anesthesia Type:General  Level of Consciousness: awake and sedated  Airway & Oxygen Therapy: Patient Spontanous Breathing and Patient connected to nasal cannula oxygen  Post-op Assessment: Report given to RN and Post -op Vital signs reviewed and stable  Post vital signs: Reviewed and stable  Last Vitals:  Filed Vitals:   07/29/15 1005  BP: 169/66  Pulse: 93  Temp: 36.6 C  Resp: 18    Last Pain: There were no vitals filed for this visit.       Complications: No apparent anesthesia complications

## 2015-07-29 NOTE — H&P (Signed)
Primary Care Physician:  Corky DownsMASOUD,JAVED, MD Primary Gastroenterologist:  Dr. Mechele CollinElliott dysphagia Pre-Procedure History & Physical: HPI:  Tiffany Jenkins is a 82 y.o. female is here for an endoscopy.   Past Medical History  Diagnosis Date  . Urinary tract infection   . Head injury   . DDD (degenerative disc disease), lumbar   . Urinary frequency   . Abdominal pain, right upper quadrant   . Vaginal itching   . Pain of left lower extremity   . Candidiasis of breast   . At risk for falls   . Elevated BP   . Dizziness   . Atherosclerosis of native arteries of extremities with rest pain, unspecified extremity (HCC)   . Synovial cyst of popliteal space   . Wound of left leg   . Urinary incontinence   . Vaginal atrophy   . Obesity   . Pelvic pain in female   . Hypertension     Past Surgical History  Procedure Laterality Date  . Appendectomy    . Rectocele repair    . Abdominal hysterectomy    . Tonsillectomy    . Bladder track      x 2    Prior to Admission medications   Medication Sig Start Date End Date Taking? Authorizing Provider  amLODipine (NORVASC) 5 MG tablet Reported on 07/07/2015 05/21/15   Historical Provider, MD  azithromycin (ZITHROMAX) 250 MG tablet Reported on 07/07/2015 05/17/15   Historical Provider, MD  conjugated estrogens (PREMARIN) vaginal cream Place 1 Applicatorful vaginally daily. Apply 0.5mg  (pea-sized amount)  just inside the vaginal introitus with a finger-tip every night for two weeks and then Monday, Wednesday and Friday nights. 06/23/15   Harle BattiestShannon A McGowan, PA-C  estradiol (ESTRACE VAGINAL) 0.1 MG/GM vaginal cream Apply 0.5mg  (pea-sized amount)  just inside the vaginal introitus with a finger-tip every night for two weeks and then Monday, Wednesday and Friday nights. Patient not taking: Reported on 07/07/2015 06/23/15   Harle BattiestShannon A McGowan, PA-C  fluconazole (DIFLUCAN) 100 MG tablet Reported on 07/07/2015 06/07/15   Historical Provider, MD  fluorouracil  (EFUDEX) 5 % cream Reported on 07/07/2015 06/16/15   Historical Provider, MD  hydrochlorothiazide (HYDRODIURIL) 12.5 MG tablet Take 1 tablet (12.5 mg total) by mouth daily. Patient not taking: Reported on 06/23/2015 10/01/14   Ellyn HackSyed Asad A Shah, MD  nitrofurantoin, macrocrystal-monohydrate, (MACROBID) 100 MG capsule Take 1 capsule (100 mg total) by mouth every 12 (twelve) hours. Patient not taking: Reported on 07/07/2015 06/25/15   Harle BattiestShannon A McGowan, PA-C  oxybutynin (DITROPAN XL) 15 MG 24 hr tablet Reported on 07/07/2015 06/02/15   Historical Provider, MD    Allergies as of 07/28/2015 - Review Complete 07/07/2015  Allergen Reaction Noted  . Codeine  06/21/2015  . Sulfa antibiotics  06/21/2015    Family History  Problem Relation Age of Onset  . Prostate cancer Son   . Kidney disease Mother   . Breast cancer Sister     Social History   Social History  . Marital Status: Married    Spouse Name: N/A  . Number of Children: N/A  . Years of Education: N/A   Occupational History  . Not on file.   Social History Main Topics  . Smoking status: Never Smoker   . Smokeless tobacco: Not on file  . Alcohol Use: No  . Drug Use: No  . Sexual Activity: Not on file   Other Topics Concern  . Not on file   Social History Narrative  Review of Systems: See HPI, otherwise negative ROS  Physical Exam: BP 169/66 mmHg  Pulse 93  Temp(Src) 97.8 F (36.6 C) (Tympanic)  Resp 18  Ht  (1.549 m)  Wt 67.132 kg (148 lb)  BMI 27.98 kg/m2  SpO2 99% General:   Alert,  pleasant and cooperative in NAD Head:  Normocephalic and atraumatic. Neck:  Supple; no masses or thyromegaly. Lungs:  Clear throughout to auscultation.    Heart:  Regular rate and rhythm. Abdomen:  Soft, nontender and nondistended. Normal bowel sounds, without guarding, and without rebound.   Neurologic:  Alert and  oriented x4;  grossly normal neurologically.  Impression/Plan: Tiffany Jenkins is here for an endoscopy  to be performed for dysphagia  Risks, benefits, limitations, and alternatives regarding  endoscopy have been reviewed with the patient.  Questions have been answered.  All parties agreeable.   Lynnae Prude, MD  07/29/2015, 10:45 AM

## 2015-07-29 NOTE — Anesthesia Postprocedure Evaluation (Signed)
Anesthesia Post Note  Patient: Tiffany Jenkins  Procedure(s) Performed: Procedure(s) (LRB): ESOPHAGOGASTRODUODENOSCOPY (EGD) WITH PROPOFOL (N/A) BALLOON DILATION (N/A)  Patient location during evaluation: PACU Anesthesia Type: General Level of consciousness: awake and alert and oriented Pain management: pain level controlled Vital Signs Assessment: post-procedure vital signs reviewed and stable Respiratory status: spontaneous breathing Cardiovascular status: blood pressure returned to baseline Anesthetic complications: no    Last Vitals:  Filed Vitals:   07/29/15 1210 07/29/15 1220  BP: 143/54 150/62  Pulse: 71 73  Temp:    Resp: 23 25    Last Pain:  Filed Vitals:   07/29/15 1242  PainSc: 0-No pain                 Izeah Vossler

## 2015-07-29 NOTE — Anesthesia Preprocedure Evaluation (Signed)
Anesthesia Evaluation  Patient identified by MRN, date of birth, ID band Patient awake    Reviewed: Allergy & Precautions, NPO status , Patient's Chart, lab work & pertinent test results  Airway Mallampati: III  TM Distance: <3 FB     Dental  (+) Upper Dentures, Partial Lower   Pulmonary neg pulmonary ROS,    Pulmonary exam normal        Cardiovascular hypertension, Pt. on medications + Peripheral Vascular Disease  Normal cardiovascular exam     Neuro/Psych Head injury in the past negative neurological ROS  negative psych ROS   GI/Hepatic Abdominal pain   Endo/Other    Renal/GU   negative genitourinary   Musculoskeletal  (+) Arthritis , Osteoarthritis,  Degenerative disc disease   Abdominal Normal abdominal exam  (+)   Peds negative pediatric ROS (+)  Hematology   Anesthesia Other Findings   Reproductive/Obstetrics                             Anesthesia Physical Anesthesia Plan  ASA: III  Anesthesia Plan: General   Post-op Pain Management:    Induction: Intravenous  Airway Management Planned: Nasal Cannula  Additional Equipment:   Intra-op Plan:   Post-operative Plan:   Informed Consent: I have reviewed the patients History and Physical, chart, labs and discussed the procedure including the risks, benefits and alternatives for the proposed anesthesia with the patient or authorized representative who has indicated his/her understanding and acceptance.   Dental advisory given  Plan Discussed with: CRNA and Surgeon  Anesthesia Plan Comments:         Anesthesia Quick Evaluation

## 2015-07-30 ENCOUNTER — Encounter: Payer: Self-pay | Admitting: Unknown Physician Specialty

## 2015-08-03 ENCOUNTER — Other Ambulatory Visit: Payer: Medicare Other

## 2015-08-04 ENCOUNTER — Other Ambulatory Visit: Payer: Medicare Other | Admitting: Urology

## 2015-08-30 NOTE — Procedures (Signed)
The patient was moved into the endoscopy room and hooked up to monitor and oxygen and began having chest pain and her heart rate went up and we decided not to do the procedure because this seemed to be angina attack.  The procedure was cancelled before the scope was ever introduced into her  Mouth.

## 2015-11-25 ENCOUNTER — Telehealth: Payer: Self-pay | Admitting: Family Medicine

## 2016-07-03 ENCOUNTER — Emergency Department: Payer: Medicare Other

## 2016-07-03 DIAGNOSIS — R109 Unspecified abdominal pain: Secondary | ICD-10-CM

## 2016-07-03 DIAGNOSIS — Z7189 Other specified counseling: Secondary | ICD-10-CM

## 2016-07-03 DIAGNOSIS — N179 Acute kidney failure, unspecified: Secondary | ICD-10-CM | POA: Diagnosis present

## 2016-07-03 DIAGNOSIS — J189 Pneumonia, unspecified organism: Secondary | ICD-10-CM | POA: Diagnosis present

## 2016-07-03 DIAGNOSIS — G8929 Other chronic pain: Secondary | ICD-10-CM | POA: Diagnosis present

## 2016-07-03 DIAGNOSIS — I255 Ischemic cardiomyopathy: Secondary | ICD-10-CM | POA: Diagnosis present

## 2016-07-03 DIAGNOSIS — I10 Essential (primary) hypertension: Secondary | ICD-10-CM | POA: Diagnosis present

## 2016-07-03 DIAGNOSIS — R778 Other specified abnormalities of plasma proteins: Secondary | ICD-10-CM

## 2016-07-03 DIAGNOSIS — J9690 Respiratory failure, unspecified, unspecified whether with hypoxia or hypercapnia: Secondary | ICD-10-CM

## 2016-07-03 DIAGNOSIS — Z882 Allergy status to sulfonamides status: Secondary | ICD-10-CM

## 2016-07-03 DIAGNOSIS — N3281 Overactive bladder: Secondary | ICD-10-CM | POA: Diagnosis present

## 2016-07-03 DIAGNOSIS — J9600 Acute respiratory failure, unspecified whether with hypoxia or hypercapnia: Secondary | ICD-10-CM

## 2016-07-03 DIAGNOSIS — J9601 Acute respiratory failure with hypoxia: Secondary | ICD-10-CM | POA: Diagnosis present

## 2016-07-03 DIAGNOSIS — M549 Dorsalgia, unspecified: Secondary | ICD-10-CM | POA: Diagnosis present

## 2016-07-03 DIAGNOSIS — Z515 Encounter for palliative care: Secondary | ICD-10-CM

## 2016-07-03 DIAGNOSIS — Z66 Do not resuscitate: Secondary | ICD-10-CM | POA: Diagnosis present

## 2016-07-03 DIAGNOSIS — Z79899 Other long term (current) drug therapy: Secondary | ICD-10-CM

## 2016-07-03 DIAGNOSIS — I214 Non-ST elevation (NSTEMI) myocardial infarction: Secondary | ICD-10-CM | POA: Diagnosis present

## 2016-07-03 DIAGNOSIS — Z9181 History of falling: Secondary | ICD-10-CM

## 2016-07-03 DIAGNOSIS — Z880 Allergy status to penicillin: Secondary | ICD-10-CM

## 2016-07-03 DIAGNOSIS — N289 Disorder of kidney and ureter, unspecified: Secondary | ICD-10-CM

## 2016-07-03 DIAGNOSIS — E871 Hypo-osmolality and hyponatremia: Secondary | ICD-10-CM

## 2016-07-03 DIAGNOSIS — D72829 Elevated white blood cell count, unspecified: Secondary | ICD-10-CM

## 2016-07-03 DIAGNOSIS — M5136 Other intervertebral disc degeneration, lumbar region: Secondary | ICD-10-CM | POA: Diagnosis present

## 2016-07-03 DIAGNOSIS — J810 Acute pulmonary edema: Secondary | ICD-10-CM

## 2016-07-03 DIAGNOSIS — I447 Left bundle-branch block, unspecified: Secondary | ICD-10-CM | POA: Diagnosis present

## 2016-07-03 DIAGNOSIS — I959 Hypotension, unspecified: Secondary | ICD-10-CM

## 2016-07-03 DIAGNOSIS — I110 Hypertensive heart disease with heart failure: Secondary | ICD-10-CM | POA: Diagnosis present

## 2016-07-03 DIAGNOSIS — R7989 Other specified abnormal findings of blood chemistry: Secondary | ICD-10-CM

## 2016-07-03 DIAGNOSIS — I5021 Acute systolic (congestive) heart failure: Secondary | ICD-10-CM

## 2016-07-03 DIAGNOSIS — N390 Urinary tract infection, site not specified: Secondary | ICD-10-CM | POA: Diagnosis present

## 2016-07-03 DIAGNOSIS — Z885 Allergy status to narcotic agent status: Secondary | ICD-10-CM

## 2016-07-03 DIAGNOSIS — R1011 Right upper quadrant pain: Secondary | ICD-10-CM | POA: Diagnosis not present

## 2016-07-03 LAB — CBC
HEMATOCRIT: 40 % (ref 35.0–47.0)
Hemoglobin: 13.3 g/dL (ref 12.0–16.0)
MCH: 30.8 pg (ref 26.0–34.0)
MCHC: 33.3 g/dL (ref 32.0–36.0)
MCV: 92.7 fL (ref 80.0–100.0)
Platelets: 224 10*3/uL (ref 150–440)
RBC: 4.31 MIL/uL (ref 3.80–5.20)
RDW: 13.4 % (ref 11.5–14.5)
WBC: 11 10*3/uL (ref 3.6–11.0)

## 2016-07-03 LAB — COMPREHENSIVE METABOLIC PANEL
ALT: 19 U/L (ref 14–54)
ANION GAP: 9 (ref 5–15)
AST: 41 U/L (ref 15–41)
Albumin: 3.9 g/dL (ref 3.5–5.0)
Alkaline Phosphatase: 58 U/L (ref 38–126)
BILIRUBIN TOTAL: 1.3 mg/dL — AB (ref 0.3–1.2)
BUN: 15 mg/dL (ref 6–20)
CHLORIDE: 105 mmol/L (ref 101–111)
CO2: 20 mmol/L — ABNORMAL LOW (ref 22–32)
Calcium: 9 mg/dL (ref 8.9–10.3)
Creatinine, Ser: 1.01 mg/dL — ABNORMAL HIGH (ref 0.44–1.00)
GFR, EST AFRICAN AMERICAN: 55 mL/min — AB (ref >60)
GFR, EST NON AFRICAN AMERICAN: 47 mL/min — AB (ref >60)
Glucose, Bld: 113 mg/dL — ABNORMAL HIGH (ref 65–99)
POTASSIUM: 4.5 mmol/L (ref 3.5–5.1)
Sodium: 134 mmol/L — ABNORMAL LOW (ref 135–145)
TOTAL PROTEIN: 7 g/dL (ref 6.5–8.1)

## 2016-07-03 LAB — URINALYSIS, COMPLETE (UACMP) WITH MICROSCOPIC
BILIRUBIN URINE: NEGATIVE
Glucose, UA: NEGATIVE mg/dL
HGB URINE DIPSTICK: NEGATIVE
KETONES UR: 20 mg/dL — AB
NITRITE: NEGATIVE
Protein, ur: 30 mg/dL — AB
SPECIFIC GRAVITY, URINE: 1.027 (ref 1.005–1.030)
pH: 5 (ref 5.0–8.0)

## 2016-07-03 LAB — LIPASE, BLOOD: Lipase: 21 U/L (ref 11–51)

## 2016-07-03 LAB — TROPONIN I: TROPONIN I: 2.24 ng/mL — AB (ref <0.03)

## 2016-07-03 MED ORDER — ONDANSETRON HCL 4 MG/2ML IJ SOLN
4.0000 mg | Freq: Once | INTRAMUSCULAR | Status: AC
  Administered 2016-07-03: 4 mg via INTRAVENOUS
  Filled 2016-07-03: qty 2

## 2016-07-03 MED ORDER — MORPHINE SULFATE (PF) 2 MG/ML IV SOLN
2.0000 mg | Freq: Once | INTRAVENOUS | Status: AC
  Administered 2016-07-03: 2 mg via INTRAVENOUS
  Filled 2016-07-03: qty 1

## 2016-07-03 NOTE — ED Notes (Signed)
Lab calls to report needs add't blood for troponin, unable to add-on to previous collection; charge nurse notified; pt taken to room 15, placed in hosp gown & on card monitor for EKG; care nurse notifed of pt & need for blood draw for troponin

## 2016-07-03 NOTE — ED Provider Notes (Addendum)
[facility] Emergency Department Provider Note   ____________________________________________   First MD Initiated Contact with Patient 07/05/2016 2305     (approximate)  I have reviewed the triage vital signs and the nursing notes.   HISTORY  Chief Complaint Abdominal Pain    HPI Tiffany Jenkins is a 83 y.o. female who comes into the hospital today with abdominal pain. According to the patient she's been having some abdominal pain in her right upper quadrant/epigastric area that radiates around to her back. The patient's pain started last night. She reports that she's had several spasms of her gallbladder in the past and her doctor told her this may be her gallbladder. The patient took an aspirin last night but hasn't taken anything since then. She also has mean anything because she's had some nausea with no vomiting. The patient has esophagus problems that she usually can't vomit. She's had some arm pain as well as some back pain. The patient has had some shortness of breath as well. She's had heart problems and heart flutters in the past and she reports that when her heart flutters it does hurt in her chest. The patient rates her pain a 10 out of 10 in intensity currently. She went to stand up she did feel as though she garbled her speech for a short amount of time. The patient also thinks she may have blacked out at her doctor's office. The patient is here today for evaluation of this pain.   Past Medical History:  Diagnosis Date  . Abdominal pain, right upper quadrant   . At risk for falls   . Atherosclerosis of native arteries of extremities with rest pain, unspecified extremity (HCC)   . Candidiasis of breast   . DDD (degenerative disc disease), lumbar   . Dizziness   . Elevated BP   . Head injury   . Hypertension   . Obesity   . Pain of left lower extremity   . Pelvic pain in female   . Synovial cyst of popliteal space   . Urinary  frequency   . Urinary incontinence   . Urinary tract infection   . Vaginal atrophy   . Vaginal itching   . Wound of left leg     Patient Active Problem List   Diagnosis Date Noted  . NSTEMI (non-ST elevated myocardial infarction) (HCC) 07/04/2016  . HTN (hypertension) 07/04/2016  . Overactive bladder 07/04/2016  . Recurrent UTI 06/27/2015  . Atrophic vaginitis 06/27/2015  . Incontinence 06/27/2015    Past Surgical History:  Procedure Laterality Date  . ABDOMINAL HYSTERECTOMY    . APPENDECTOMY    . BALLOON DILATION N/A 07/29/2015   Procedure: BALLOON DILATION;  Surgeon: Scot Jun, MD;  Location: [facility] ENDOSCOPY;  Service: Endoscopy;  Laterality: N/A;  . bladder track     x 2  . ESOPHAGOGASTRODUODENOSCOPY (EGD) WITH PROPOFOL N/A 07/29/2015   Procedure: ESOPHAGOGASTRODUODENOSCOPY (EGD) WITH PROPOFOL;  Surgeon: Scot Jun, MD;  Location: [facility] ENDOSCOPY;  Service: Endoscopy;  Laterality: N/A;  . RECTOCELE REPAIR    . TONSILLECTOMY      Prior to Admission medications   Medication Sig Start Date End Date Taking? Authorizing Provider  amLODipine (NORVASC) 5 MG tablet Reported on 07/07/2015 05/21/15  Yes [provider]    Allergies Codeine; Penicillins; and Sulfa antibiotics  Family History  Problem Relation Age of Onset  . Prostate cancer Son   . Kidney disease Mother   . Breast cancer Sister  Social History Social History  Substance Use Topics  . Smoking status: Never Smoker  . Smokeless tobacco: Not on file  . Alcohol use No    Review of Systems  Constitutional: No fever/chills Eyes: No visual changes. ENT: No sore throat. Cardiovascular: Denies chest pain. Respiratory:  shortness of breath. Gastrointestinal:  abdominal pain.  nausea, no vomiting.  No diarrhea.  No constipation. Genitourinary: Negative for dysuria. Musculoskeletal: back pain. Skin: Negative for rash. Neurological: Negative for headaches, focal weakness or  numbness.   ____________________________________________   PHYSICAL EXAM:  VITAL SIGNS: ED Triage Vitals [07-12-16 1830]  Enc Vitals Group     BP 127/62     Pulse Rate (!) 110     Resp 16     Temp 98.4 F (36.9 C)     Temp Source Oral     SpO2 95 %     Weight 140 lb (63.5 kg)     Height 5\' 5"  (1.651 m)     Head Circumference      Peak Flow      Pain Score 9     Pain Loc      Pain Edu?      Excl. in GC?     Constitutional: Alert and oriented. Well appearing and in Moderate distress. Eyes: Conjunctivae are normal. PERRL. EOMI. Head: Atraumatic. Nose: No congestion/rhinnorhea. Mouth/Throat: Mucous membranes are moist.  Oropharynx non-erythematous. Cardiovascular: Tachycardia, regular rhythm. Grossly normal heart sounds.  Good peripheral circulation. Respiratory: Increased respiratory effort with tachypnea No retractions. Lungs CTAB. Gastrointestinal: Soft and nontender. No distention. Positive bowel sounds Musculoskeletal: No lower extremity tenderness nor edema.  Neurologic:  Normal speech and language. Skin:  Skin is warm, dry and intact.  Psychiatric: Mood and affect are normal.   ____________________________________________   LABS (all labs ordered are listed, but only abnormal results are displayed)  Labs Reviewed  COMPREHENSIVE METABOLIC PANEL - Abnormal; Notable for the following:       Result Value   Sodium 134 (*)    CO2 20 (*)    Glucose, Bld 113 (*)    Creatinine, Ser 1.01 (*)    Total Bilirubin 1.3 (*)    GFR calc non Af Amer 47 (*)    GFR calc Af Amer 55 (*)    All other components within normal limits  URINALYSIS, COMPLETE (UACMP) WITH MICROSCOPIC - Abnormal; Notable for the following:    Color, Urine YELLOW (*)    APPearance HAZY (*)    Ketones, ur 20 (*)    Protein, ur 30 (*)    Leukocytes, UA TRACE (*)    Bacteria, UA RARE (*)    Squamous Epithelial / LPF 6-30 (*)    All other components within normal limits  TROPONIN I - Abnormal;  Notable for the following:    Troponin I 2.24 (*)    All other components within normal limits  TROPONIN I - Abnormal; Notable for the following:    Troponin I 1.85 (*)    All other components within normal limits  LIPASE, BLOOD  CBC  TROPONIN I  TROPONIN I  PROTIME-INR  APTT  HEPARIN LEVEL (UNFRACTIONATED)   ____________________________________________  EKG  ED ECG REPORT I, Rebecka Apley, the attending physician, personally viewed and interpreted this ECG.   Date: 07-12-16  EKG Time: 2210  Rate: 113  Rhythm: sinus tachycardia, LBBB  Axis: normal  Intervals:left bundle branch block  ST&T Change: flipped t waves in lead I and aVL, ST  depression lead II, III and aVF and V6  ____________________________________________  RADIOLOGY  CXR [place] abd RUQ ____________________________________________   PROCEDURES  Procedure(s) performed: None  Procedures  Critical Care performed: Yes, see critical care note(s)   CRITICAL CARE Performed by: Lucrezia Europe P   Total critical care time: 60 minutes  Critical care time was exclusive of separately billable procedures and treating other patients.  Critical care was necessary to treat or prevent imminent or life-threatening deterioration.  Critical care was time spent personally by me on the following activities: development of treatment plan with patient and/or surrogate as well as nursing, discussions with consultants, evaluation of patient's response to treatment, examination of patient, obtaining history from patient or surrogate, ordering and performing treatments and interventions, ordering and review of laboratory studies, ordering and review of radiographic studies, pulse oximetry and re-evaluation of patient's condition.   ____________________________________________   INITIAL IMPRESSION / ASSESSMENT AND PLAN / ED COURSE  Pertinent labs & imaging results that were available during my care of the patient  were reviewed by me and considered in my medical decision making (see chart for details).  This is a 83 year old female who comes into the hospital today with some abdominal pain. The patient did have a troponin done as well as an EKG and her troponin was elevated at 2.24. The patient's EKG did show what left bundle branch block which was new compared to her previous EKG from June 2017. I did contact the cardiologist on-call Dr. Kirke Corin and he felt that the EKG showed a progression of conduction disease and was less likely to be acute ischemia. He recommended treating the troponin as an NSTEMI. I did give the patient some morphine and Zofran for her pain. I also sent the patient for an ultrasound which did not show any gallbladder disease. The patient did have some hypoxia in the emergency department so I did place her on 2 L by nasal cannula. She was bumped up to 4 L when she moved and became more acutely hypoxic. The patient will be admitted to the hospitalist service for evaluation of this and STEMI.  Clinical Course as of Jul 04 457  Tue Jul 04, 2016  0035 Shallow inspiration. Cardiac enlargement. No evidence of active pulmonary disease.   DG Chest Portable 1 View [AW]  0058 1. No acute abnormality at the right upper quadrant. 2. Small right pleural effusion noted. 3. Multiple hepatic cysts again noted.   US Abdomen Limited RUQ [AW]  0445 Examination is technically limited due to motion artifact. Diffuse vascular calcification. No evidence of aneurysm or dissection of the thoracic or abdominal aorta. Major vessels appear patent.  Diffuse airspace consolidation throughout both lungs with small pleural effusions. This could be due to multifocal pneumonia, edema, or ARDS. No acute process demonstrated in the abdomen or pelvis.   CT Angio Chest/Abd/Pel for Dissection W and/or Wo Contrast [AW]    Clinical Course User Index [AW] Rebecka Apley, MD    After the patient was admitted she  was doing well but she became more hypoxic and continued to have some difficulty breathing. The admitting doctor went into evaluate the patient and noticed that she was struggling to breathe and diaphoretic. The nurse did inform me that this was a new development as the patient had been well when I went in to reassess her. Listening to the patient she does have some wheezing and crackles with a concern for acute pulmonary edema. The decision was made to  give the patient some Lasix and started on BiPAP. Initially the patient did not want to mask on her face but after discussing that this was the best thing for her the patient was able to tolerate the mask on her face without much difficulty. The patient's hypoxia did improve and her tachycardia did improve as well. The patient did become hypoxic and tachycardic whenever she did exert herself such as using the bedpan but her wrist or rate also did improve from the 40s to the 30s. As the patient is currently on BiPAP she will be admitted to the stepdown unit for further evaluation. All of these decisions were made along with the hospitalist admitting physician. The patient will be admitted to the stepdown unit in the ICU. The patient did receive a CT scan of her chest abdomen and pelvis to ensure that she did not have a dissection or AAA that was ruptured and it was negative for dissection or AAA. It did show that she had some pleural effusions and some opacities. ____________________________________________   FINAL CLINICAL IMPRESSION(S) / ED DIAGNOSES  Final diagnoses:  Abdominal pain  Elevated troponin  NSTEMI (non-ST elevated myocardial infarction) (HCC)  Acute pulmonary edema (HCC)      NEW MEDICATIONS STARTED DURING THIS VISIT:  New Prescriptions   No medications on file     Note:  This document was prepared using Dragon voice recognition software and may include unintentional dictation errors.    Rebecka ApleyWebster, Vallory Oetken P, MD 07/04/16 0149     Rebecka ApleyWebster, Daemyn Gariepy P, MD 07/04/16 [phone]

## 2016-07-03 NOTE — ED Triage Notes (Signed)
Pt presents to ED c/o abdominal pain . Pt was seen by PCP today and was told "it may be due to the gallbladder". Pt with nausea, denies vomiting.

## 2016-07-03 NOTE — ED Notes (Signed)
Reviewed pt's chart & cc; EKG and troponin ordered

## 2016-07-04 ENCOUNTER — Encounter: Payer: Self-pay | Admitting: Internal Medicine

## 2016-07-04 ENCOUNTER — Inpatient Hospital Stay: Payer: Medicare Other

## 2016-07-04 DIAGNOSIS — Z9181 History of falling: Secondary | ICD-10-CM | POA: Diagnosis not present

## 2016-07-04 DIAGNOSIS — I214 Non-ST elevation (NSTEMI) myocardial infarction: Principal | ICD-10-CM

## 2016-07-04 DIAGNOSIS — J9601 Acute respiratory failure with hypoxia: Secondary | ICD-10-CM

## 2016-07-04 DIAGNOSIS — Z79899 Other long term (current) drug therapy: Secondary | ICD-10-CM | POA: Diagnosis not present

## 2016-07-04 DIAGNOSIS — Z515 Encounter for palliative care: Secondary | ICD-10-CM | POA: Diagnosis not present

## 2016-07-04 DIAGNOSIS — I959 Hypotension, unspecified: Secondary | ICD-10-CM | POA: Diagnosis present

## 2016-07-04 DIAGNOSIS — J189 Pneumonia, unspecified organism: Secondary | ICD-10-CM | POA: Diagnosis present

## 2016-07-04 DIAGNOSIS — Z885 Allergy status to narcotic agent status: Secondary | ICD-10-CM | POA: Diagnosis not present

## 2016-07-04 DIAGNOSIS — N390 Urinary tract infection, site not specified: Secondary | ICD-10-CM | POA: Diagnosis present

## 2016-07-04 DIAGNOSIS — I255 Ischemic cardiomyopathy: Secondary | ICD-10-CM | POA: Diagnosis present

## 2016-07-04 DIAGNOSIS — N3281 Overactive bladder: Secondary | ICD-10-CM | POA: Diagnosis present

## 2016-07-04 DIAGNOSIS — R1011 Right upper quadrant pain: Secondary | ICD-10-CM | POA: Diagnosis present

## 2016-07-04 DIAGNOSIS — J810 Acute pulmonary edema: Secondary | ICD-10-CM | POA: Diagnosis present

## 2016-07-04 DIAGNOSIS — I10 Essential (primary) hypertension: Secondary | ICD-10-CM | POA: Diagnosis present

## 2016-07-04 DIAGNOSIS — E871 Hypo-osmolality and hyponatremia: Secondary | ICD-10-CM | POA: Diagnosis present

## 2016-07-04 DIAGNOSIS — N179 Acute kidney failure, unspecified: Secondary | ICD-10-CM | POA: Diagnosis present

## 2016-07-04 DIAGNOSIS — I110 Hypertensive heart disease with heart failure: Secondary | ICD-10-CM | POA: Diagnosis present

## 2016-07-04 DIAGNOSIS — J9600 Acute respiratory failure, unspecified whether with hypoxia or hypercapnia: Secondary | ICD-10-CM | POA: Diagnosis not present

## 2016-07-04 DIAGNOSIS — R748 Abnormal levels of other serum enzymes: Secondary | ICD-10-CM | POA: Diagnosis not present

## 2016-07-04 DIAGNOSIS — G8929 Other chronic pain: Secondary | ICD-10-CM | POA: Diagnosis present

## 2016-07-04 DIAGNOSIS — Z882 Allergy status to sulfonamides status: Secondary | ICD-10-CM | POA: Diagnosis not present

## 2016-07-04 DIAGNOSIS — M5136 Other intervertebral disc degeneration, lumbar region: Secondary | ICD-10-CM | POA: Diagnosis present

## 2016-07-04 DIAGNOSIS — I447 Left bundle-branch block, unspecified: Secondary | ICD-10-CM | POA: Diagnosis present

## 2016-07-04 DIAGNOSIS — Z66 Do not resuscitate: Secondary | ICD-10-CM | POA: Diagnosis present

## 2016-07-04 DIAGNOSIS — M549 Dorsalgia, unspecified: Secondary | ICD-10-CM | POA: Diagnosis present

## 2016-07-04 DIAGNOSIS — I5021 Acute systolic (congestive) heart failure: Secondary | ICD-10-CM | POA: Diagnosis present

## 2016-07-04 DIAGNOSIS — Z880 Allergy status to penicillin: Secondary | ICD-10-CM | POA: Diagnosis not present

## 2016-07-04 DIAGNOSIS — Z7189 Other specified counseling: Secondary | ICD-10-CM | POA: Diagnosis not present

## 2016-07-04 LAB — BASIC METABOLIC PANEL
Anion gap: 10 (ref 5–15)
BUN: 19 mg/dL (ref 6–20)
CALCIUM: 8.5 mg/dL — AB (ref 8.9–10.3)
CO2: 21 mmol/L — AB (ref 22–32)
CREATININE: 1.1 mg/dL — AB (ref 0.44–1.00)
Chloride: 103 mmol/L (ref 101–111)
GFR calc non Af Amer: 43 mL/min — ABNORMAL LOW (ref >60)
GFR, EST AFRICAN AMERICAN: 49 mL/min — AB (ref >60)
Glucose, Bld: 144 mg/dL — ABNORMAL HIGH (ref 65–99)
Potassium: 4.3 mmol/L (ref 3.5–5.1)
SODIUM: 134 mmol/L — AB (ref 135–145)

## 2016-07-04 LAB — PROTIME-INR
INR: 1.17
PROTHROMBIN TIME: 15 s (ref 11.4–15.2)

## 2016-07-04 LAB — TROPONIN I
TROPONIN I: 1.85 ng/mL — AB (ref <0.03)
Troponin I: 2.25 ng/mL (ref <0.03)
Troponin I: 3.17 ng/mL (ref <0.03)

## 2016-07-04 LAB — CBC
HCT: 40.4 % (ref 35.0–47.0)
Hemoglobin: 13.5 g/dL (ref 12.0–16.0)
MCH: 30.3 pg (ref 26.0–34.0)
MCHC: 33.3 g/dL (ref 32.0–36.0)
MCV: 91.1 fL (ref 80.0–100.0)
Platelets: 243 10*3/uL (ref 150–440)
RBC: 4.43 MIL/uL (ref 3.80–5.20)
RDW: 13.3 % (ref 11.5–14.5)
WBC: 17.3 10*3/uL — ABNORMAL HIGH (ref 3.6–11.0)

## 2016-07-04 LAB — GLUCOSE, CAPILLARY: GLUCOSE-CAPILLARY: 155 mg/dL — AB (ref 65–99)

## 2016-07-04 LAB — PROCALCITONIN: Procalcitonin: <0.1 ng/mL

## 2016-07-04 LAB — APTT: aPTT: 91 seconds — ABNORMAL HIGH (ref 24–36)

## 2016-07-04 LAB — MRSA PCR SCREENING: MRSA BY PCR: NEGATIVE

## 2016-07-04 LAB — HEPARIN LEVEL (UNFRACTIONATED)
HEPARIN UNFRACTIONATED: 0.57 [IU]/mL (ref 0.30–0.70)
Heparin Unfractionated: 0.55 IU/mL (ref 0.30–0.70)

## 2016-07-04 LAB — BRAIN NATRIURETIC PEPTIDE: B NATRIURETIC PEPTIDE 5: 3180 pg/mL — AB (ref 0.0–100.0)

## 2016-07-04 MED ORDER — DEXTROSE 5 % IV SOLN
500.0000 mg | INTRAVENOUS | Status: DC
  Administered 2016-07-04: 500 mg via INTRAVENOUS
  Filled 2016-07-04 (×2): qty 500

## 2016-07-04 MED ORDER — LORAZEPAM 0.5 MG PO TABS
ORAL_TABLET | ORAL | Status: AC
  Filled 2016-07-04: qty 1

## 2016-07-04 MED ORDER — CEFTRIAXONE SODIUM 1 G IJ SOLR
1.0000 g | INTRAMUSCULAR | Status: DC
  Filled 2016-07-04: qty 10

## 2016-07-04 MED ORDER — LORAZEPAM 2 MG/ML IJ SOLN
INTRAMUSCULAR | Status: AC
Start: 2016-07-04 — End: 2016-07-04
  Filled 2016-07-04: qty 1

## 2016-07-04 MED ORDER — ONDANSETRON HCL 4 MG PO TABS: 4.0000 mg | ORAL_TABLET | Freq: Four times a day (QID) | ORAL | Status: DC | PRN

## 2016-07-04 MED ORDER — ORAL CARE MOUTH RINSE
15.0000 mL | Freq: Two times a day (BID) | OROMUCOSAL | Status: DC
  Administered 2016-07-04 – 2016-07-06 (×5): 15 mL via OROMUCOSAL

## 2016-07-04 MED ORDER — HEPARIN (PORCINE) IN NACL 100-0.45 UNIT/ML-% IJ SOLN
800.0000 [IU]/h | INTRAMUSCULAR | Status: DC
  Administered 2016-07-04 – 2016-07-05 (×2): 800 [IU]/h via INTRAVENOUS
  Filled 2016-07-04 (×3): qty 250

## 2016-07-04 MED ORDER — DEXTROSE 5 % IV SOLN
1.0000 g | INTRAVENOUS | Status: DC
  Administered 2016-07-04: 1 g via INTRAVENOUS
  Filled 2016-07-04: qty 10

## 2016-07-04 MED ORDER — METOPROLOL TARTRATE 5 MG/5ML IV SOLN
5.0000 mg | Freq: Four times a day (QID) | INTRAVENOUS | Status: DC
  Administered 2016-07-04 (×2): 5 mg via INTRAVENOUS
  Filled 2016-07-04: qty 5

## 2016-07-04 MED ORDER — FUROSEMIDE 10 MG/ML IJ SOLN
40.0000 mg | Freq: Once | INTRAMUSCULAR | Status: AC
  Administered 2016-07-04: 40 mg via INTRAVENOUS
  Filled 2016-07-04: qty 4

## 2016-07-04 MED ORDER — AMLODIPINE BESYLATE 5 MG PO TABS: 5.0000 mg | ORAL_TABLET | Freq: Every day | ORAL | Status: DC

## 2016-07-04 MED ORDER — LORAZEPAM 0.5 MG PO TABS
0.5000 mg | ORAL_TABLET | Freq: Once | ORAL | Status: AC
  Administered 2016-07-04: 0.5 mg via ORAL

## 2016-07-04 MED ORDER — IOPAMIDOL (ISOVUE-370) INJECTION 76%
100.0000 mL | Freq: Once | INTRAVENOUS | Status: AC | PRN
  Administered 2016-07-04: 100 mL via INTRAVENOUS

## 2016-07-04 MED ORDER — FAMOTIDINE IN NACL 20-0.9 MG/50ML-% IV SOLN
20.0000 mg | INTRAVENOUS | Status: DC
  Administered 2016-07-04: 20 mg via INTRAVENOUS
  Filled 2016-07-04: qty 50

## 2016-07-04 MED ORDER — FAMOTIDINE 20 MG PO TABS: 20.0000 mg | ORAL_TABLET | Freq: Every day | ORAL | Status: DC

## 2016-07-04 MED ORDER — NITROGLYCERIN 2 % TD OINT
0.5000 [in_us] | TOPICAL_OINTMENT | Freq: Once | TRANSDERMAL | Status: AC
  Administered 2016-07-04: 0.5 [in_us] via TOPICAL

## 2016-07-04 MED ORDER — CHLORHEXIDINE GLUCONATE 0.12 % MT SOLN
15.0000 mL | Freq: Two times a day (BID) | OROMUCOSAL | Status: DC
  Administered 2016-07-04 – 2016-07-08 (×7): 15 mL via OROMUCOSAL
  Filled 2016-07-04 (×5): qty 15

## 2016-07-04 MED ORDER — MORPHINE SULFATE (PF) 2 MG/ML IV SOLN: 2.0000 mg | INTRAVENOUS | Status: DC | PRN

## 2016-07-04 MED ORDER — METOPROLOL TARTRATE 5 MG/5ML IV SOLN
INTRAVENOUS | Status: AC
  Administered 2016-07-04: 5 mg via INTRAVENOUS
  Filled 2016-07-04: qty 5

## 2016-07-04 MED ORDER — ENOXAPARIN SODIUM 40 MG/0.4ML ~~LOC~~ SOLN: 40.0000 mg | SUBCUTANEOUS | Status: DC

## 2016-07-04 MED ORDER — ONDANSETRON HCL 4 MG/2ML IJ SOLN
4.0000 mg | Freq: Four times a day (QID) | INTRAMUSCULAR | Status: DC | PRN
  Administered 2016-07-05: 4 mg via INTRAVENOUS
  Filled 2016-07-04 (×3): qty 2

## 2016-07-04 MED ORDER — ACETAMINOPHEN 325 MG PO TABS
650.0000 mg | ORAL_TABLET | Freq: Four times a day (QID) | ORAL | Status: DC | PRN
  Administered 2016-07-04 – 2016-07-05 (×2): 650 mg via ORAL
  Filled 2016-07-04 (×2): qty 2

## 2016-07-04 MED ORDER — NITROGLYCERIN 2 % TD OINT
TOPICAL_OINTMENT | TRANSDERMAL | Status: AC
  Filled 2016-07-04: qty 1

## 2016-07-04 MED ORDER — HEPARIN BOLUS VIA INFUSION
3800.0000 [IU] | Freq: Once | INTRAVENOUS | Status: AC
  Administered 2016-07-04: 3800 [IU] via INTRAVENOUS
  Filled 2016-07-04: qty 3800

## 2016-07-04 MED ORDER — ACETAMINOPHEN 650 MG RE SUPP: 650.0000 mg | Freq: Four times a day (QID) | RECTAL | Status: DC | PRN

## 2016-07-04 MED ORDER — IPRATROPIUM-ALBUTEROL 0.5-2.5 (3) MG/3ML IN SOLN
3.0000 mL | Freq: Four times a day (QID) | RESPIRATORY_TRACT | Status: DC | PRN
  Administered 2016-07-05: 3 mL via RESPIRATORY_TRACT
  Filled 2016-07-04: qty 3

## 2016-07-04 MED ORDER — DEXTROSE 5 % IV SOLN
1.0000 g | INTRAVENOUS | Status: DC
  Filled 2016-07-04: qty 10

## 2016-07-04 MED ORDER — MORPHINE SULFATE (PF) 2 MG/ML IV SOLN: 2.0000 mg | Freq: Once | INTRAVENOUS | Status: DC

## 2016-07-04 NOTE — ED Notes (Signed)
PT placed on nonrebreather d/t WOB. PT anxious with mask. MD Anne HahnWillis consulted for meds.

## 2016-07-04 NOTE — Consult Note (Signed)
Name: Tiffany Jenkins MRN: [id number] DOB: 12/26/1923    ADMISSION DATE:  06/27/2016 CONSULTATION DATE: 07/04/2016  REFERRING MD : Dr. Anne Hahn  CHIEF COMPLAINT: Abdominal Pain    BRIEF PATIENT DESCRIPTION:  83 yo female admitted 05/21 with NSTEMI and acute hypoxic respiratory failure secondary to multifocal pneumonia vs. pulmonary edema vs. ARDS on CT Angio Chest results.05/22  SIGNIFICANT EVENTS  05/22-Pt admitted to [facility]   STUDIES:  CT Angio Chest 05/22>>Examination is technically limited due to motion artifact. Diffuse vascular calcification. No evidence of aneurysm or dissection of the thoracic or abdominal aorta. Major vessels appear patent. Diffuse airspace consolidation throughout both lungs with small pleural effusions. This could be due to multifocal pneumonia, edema, or ARDS. No acute process demonstrated in the abdomen or pelvis. [place] Abd Limited 05/22>>No acute abnormality at the right upper quadrant. Small right pleural effusion noted. Multiple hepatic cysts again noted.  HISTORY OF PRESENT ILLNESS:   This is a 83 yo female with a PMH of Urinary Incontinence, Obesity, HTN, Dysphagia secondary to Esophageal Strictures, Head Injury, HTN, Dizziness, Degenerative Disc Disease, Candidiasis of Breast, Atherosclerosis of native arteries of extremities with rest pain, and RUQ abdominal pain.  She presented to [facility] ER 05/22 with c/o right upper quadrant abdominal pain with radiation to the back onset 05/20.  In the ER lab results revealed troponin of 2.2 ruling pt in for NSTEMI and UA revealing UTI  The pt was also found to be in acute hypoxic  respiratory distress requiring continuous Bipap. CT Angio Chest revealing multifocal pneumonia, edema, or ARDS. Therefore, the pt was admitted to the [facility] Unit for acute hypoxic respiratory failure and NSTEMI.    PAST MEDICAL HISTORY :   has a past medical history of Abdominal pain, right upper quadrant; At risk for falls;  Atherosclerosis of native arteries of extremities with rest pain, unspecified extremity (HCC); Candidiasis of breast; DDD (degenerative disc disease), lumbar; Dizziness; Elevated BP; Head injury; Hypertension; Obesity; Pain of left lower extremity; Pelvic pain in female; Synovial cyst of popliteal space; Urinary frequency; Urinary incontinence; Urinary tract infection; Vaginal atrophy; Vaginal itching; and Wound of left leg.  has a past surgical history that includes Appendectomy; Rectocele repair; Abdominal hysterectomy; Tonsillectomy; bladder track; Esophagogastroduodenoscopy (egd) with propofol (N/A, 07/29/2015); and Balloon dilation (N/A, 07/29/2015). Prior to Admission medications   Medication Sig Start Date End Date Taking? Authorizing Provider  amLODipine (NORVASC) 5 MG tablet Reported on 07/07/2015 05/21/15  Yes [provider]   Allergies  Allergen Reactions  . Codeine Nausea And Vomiting  . Penicillins Hives and Other (See Comments)    Has patient had a PCN reaction causing immediate rash, facial/tongue/throat swelling, SOB or lightheadedness with hypotension: Unknown Has patient had a PCN reaction causing severe rash involving mucus membranes or skin necrosis: Unknown Has patient had a PCN reaction that required hospitalization: Unknown Has patient had a PCN reaction occurring within the last 10 years: No If all of the above answers are "NO", then may proceed with Cephalosporin use.   . Sulfa Antibiotics Other (See Comments)    Reaction: unknown    FAMILY HISTORY:  family history includes Breast cancer in her sister; Kidney disease in her mother; Prostate cancer in her son. SOCIAL HISTORY:  reports that she has never smoked. She does not have any smokeless tobacco history on file. She reports that she does not drink alcohol or use drugs.  REVIEW OF SYSTEMS:  Positives in BOLD  Constitutional: Negative for fever, chills,  weight loss, malaise/fatigue and diaphoresis.  HENT:  Negative for hearing loss, ear pain, nosebleeds, congestion, sore throat, neck pain, tinnitus and ear discharge.   Eyes: Negative for blurred vision, double vision, photophobia, pain, discharge and redness.  Respiratory: cough, hemoptysis, sputum production, shortness of breath, wheezing and stridor.   Cardiovascular: Negative for chest pain, palpitations, orthopnea, claudication, leg swelling and PND.  Gastrointestinal: heartburn, nausea, vomiting, abdominal pain, diarrhea, constipation, blood in stool and melena.  Genitourinary: Negative for dysuria, urgency, frequency, hematuria and flank pain.  Musculoskeletal: myalgias, back pain, joint pain and falls.  Skin: Negative for itching and rash.  Neurological: Negative for dizziness, tingling, tremors, sensory change, speech change, focal weakness, seizures, loss of consciousness, weakness and headaches.  Endo/Heme/Allergies: Negative for environmental allergies and polydipsia. Does not bruise/bleed easily.  SUBJECTIVE:  Pt currently on continuous Bipap  VITAL SIGNS: Temp:  [97.5 F (36.4 C)-98.4 F (36.9 C)] 97.5 F (36.4 C) (05/22 0530) Pulse Rate:  [90-125] 99 (05/22 0530) Resp:  [16-36] 28 (05/22 0530) BP: (112-159)/(62-86) 112/63 (05/22 0530) SpO2:  [83 %-96 %] 96 % (05/22 0530) FiO2 (%):  [100 %] 100 % (05/22 0530) Weight:  [63.5 kg (140 lb)-67.5 kg (148 lb 13 oz)] 67.5 kg (148 lb 13 oz) (05/22 0530)  PHYSICAL EXAMINATION: General: elderly Caucasian female on continuous Bipap Neuro: lethargic, oriented, follows commands HEENT: supple, no JVD Cardiovascular: nsr, s1s2, no M/R/G Lungs: rhonchi throughout, even, non labored on Bipap Abdomen: +BS x4, soft, epigastric tenderness, non distended  Musculoskeletal: normal tone, 1+ bilateral lower extremity edema  Skin: no rashes or lesions    Recent Labs Lab 06/19/2016 1833  NA 134*  K 4.5  CL 105  CO2 20*  BUN 15  CREATININE 1.01*  GLUCOSE 113*    Recent Labs Lab  06/30/2016 1833  HGB 13.3  HCT 40.0  WBC 11.0  PLT 224   Dg Chest Portable 1 View  Result Date: 07/02/2016 CLINICAL DATA:  Epigastric pain and shortness of breath EXAM: PORTABLE CHEST 1 VIEW COMPARISON:  04/24/2013 FINDINGS: Shallow inspiration with elevation of the right hemidiaphragm. Blunting of the left costophrenic angle suggesting a small effusion. No consolidation or airspace disease in the lungs. No pneumothorax. Mild cardiac enlargement with normal pulmonary vascularity. Right hilum is somewhat prominent but this is similar to previous study and likely represents prominent vascularity. Calcification and torsion of the aorta. IMPRESSION: Shallow inspiration. Cardiac enlargement. No evidence of active pulmonary disease. Electronically Signed   By: Burman Nieves M.D.   On: 06/22/2016 23:49   Ct Angio Chest/abd/pel For Dissection W And/or Wo Contrast  Result Date: 07/04/2016 CLINICAL DATA:  Chest and abdomen pain. Right upper quadrant and epigastric pain radiating to the back. Started last night. Gallbladder spasms. Nausea. Difficulty breathing. EXAM: CT ANGIOGRAPHY CHEST, ABDOMEN AND PELVIS TECHNIQUE: Multidetector CT imaging through the chest, abdomen and pelvis was performed using the standard protocol during bolus administration of intravenous contrast. Multiplanar reconstructed images and MIPs were obtained and reviewed to evaluate the vascular anatomy. CONTRAST:  100 mL Isovue 370 COMPARISON:  CT abdomen and pelvis 04/25/2013 FINDINGS: CTA CHEST FINDINGS Cardiovascular: Unenhanced images of the aorta demonstrates scattered calcifications. No evidence of intramural hematoma. Coronary artery calcifications are present. Gas within venous structures likely arises from intravenous injections. Images obtained during arterial phase after contrast injection demonstrate good opacification of the thoracic aorta. No aortic aneurysm or dissection. Great vessel origins are patent. Central pulmonary  arteries are well opacified. No evidence for significant pulmonary  embolus. Normal heart size. No pericardial effusion. Mediastinum/Nodes: No significant lymphadenopathy in the chest. Esophagus is decompressed. Lungs/Pleura: Evaluation of lungs is limited due to respiratory motion artifact. There is diffuse consolidation throughout the lungs. This may be due to multifocal pneumonia, edema, or ARDS. Airways are patent. Small bilateral pleural effusions. No pneumothorax. Musculoskeletal: Degenerative changes in the spine. No destructive bone lesions. Review of the MIP images confirms the above findings. CTA ABDOMEN AND PELVIS FINDINGS VASCULAR Aorta: Diffuse vascular calcifications. Normal caliber abdominal aorta. No evidence of aneurysm or dissection. No significant stenosis. Celiac: Patent without evidence of aneurysm, dissection, vasculitis or significant stenosis. SMA: Patent without evidence of aneurysm, dissection, vasculitis or significant stenosis. Renals: Both renal arteries are patent without evidence of aneurysm, dissection, vasculitis, fibromuscular dysplasia or significant stenosis. IMA: Patent without evidence of aneurysm, dissection, vasculitis or significant stenosis. Inflow: Patent without evidence of aneurysm, dissection, vasculitis or significant stenosis. Veins: No obvious venous abnormality within the limitations of this arterial phase study. Review of the MIP images confirms the above findings. NON-VASCULAR Hepatobiliary: Multiple low-attenuation lesions throughout the liver. The largest measures about 4 cm maximal diameter. Characterization of these lesions is difficult due to earlier arterial phase and motion artifact. Lesions likely represent cysts. No change since previous study. Gallbladder is contracted. No bile duct dilatation. Pancreas: Unremarkable. No pancreatic ductal dilatation or surrounding inflammatory changes. Spleen: Normal in size without focal abnormality. Adrenals/Urinary  Tract: Adrenal glands are unremarkable. Kidneys are normal, without renal calculi, focal lesion, or hydronephrosis. Bladder is unremarkable.PE Stomach/Bowel: Stomach, small bowel, and colon are not abnormally distended. Scattered diverticula in the colon. No evidence of diverticulitis. Appendix is not identified. Lymphatic: No significant lymphadenopathy. Reproductive: Status post hysterectomy. No adnexal masses. Other: No free air or free fluid is identified in the abdomen. Abdominal wall musculature appears intact. Examination is limited due to motion artifact. Musculoskeletal: Degenerative changes throughout the spine. No destructive bone lesions. Review of the MIP images confirms the above findings. IMPRESSION: Examination is technically limited due to motion artifact. Diffuse vascular calcification. No evidence of aneurysm or dissection of the thoracic or abdominal aorta. Major vessels appear patent. Diffuse airspace consolidation throughout both lungs with small pleural effusions. This could be due to multifocal pneumonia, edema, or ARDS. No acute process demonstrated in the abdomen or pelvis. Electronically Signed   By: Burman Nieves M.D.   On: 07/04/2016 04:16   US Abdomen Limited Ruq  Result Date: 07/04/2016 CLINICAL DATA:  Acute onset of generalized abdominal pain. Initial encounter. EXAM: US ABDOMEN LIMITED - RIGHT UPPER QUADRANT COMPARISON:  CT abdomen and pelvis performed 04/25/2013 FINDINGS: Gallbladder: No gallstones or wall thickening visualized. No sonographic Murphy sign noted by sonographer. Common bile duct: Diameter: 0.2 cm, within normal limits in caliber. Liver: Multiple hepatic cysts are again noted, measuring up to 3.7 cm in size. Within normal limits in parenchymal echogenicity. A small right pleural effusion is noted. IMPRESSION: 1. No acute abnormality at the right upper quadrant. 2. Small right pleural effusion noted. 3. Multiple hepatic cysts again noted. Electronically Signed    By: Roanna Raider M.D.   On: 07/04/2016 00:51    ASSESSMENT / PLAN: NSTEMI Acute Pain Urinary Tract Infection   Acute hypoxic respiratory failure secondary to multifocal pneumonia vs. edema vs. ARDS  Hx: HTN, RUQ abdominal pain, and Head injury P: Continuous Bipap for now wean as tolerated   Maintain O2 sats >92% CXR in am  Prn bronchodilator therapy  Trend WBC and monitor fever curve  Trend PCT  Will start abx Follow urine culture  Cardiology consulted appreciate input Heparin gtt dosing per pharmacy Monitor for s/sx of bleeding Echo pending  Trend troponin's  Continue outpatient amlodipine and metoprolol Prn morphine for pain management   Sonda Rumbleana Blakeney, AGNP  Pulmonary/Critical Care Pager [phone] (please enter 7 digits) PCCM Consult Pager [phone] (please enter 7 digits)   PCCM ATTENDING ATTESTATION:  I have evaluated patient with the APP Blakeney, reviewed database in its entirety and discussed care plan in detail. In addition, this patient was discussed on multidisciplinary rounds.   She was admitted with RUQ and chest pain, bilateral pulmonary infiltrates, acute hypoxemic respiratory failure  Important exam findings: Mildly labored on [place] O2 Requiring HFNC to maintain SpO2 > 92% Cognition intact HEENT WNL + JVD Bibasilar crackles Reg, no M Abdomen soft, NT, diminished BS Ext warm, no edema  CXR: L>R AS dz c/w asymmetric edema EKG: LBBB pattern Trop I 2.24 > 1.85 > 2.25  Major problems addressed by PCCM team: Acute hypoxemic respiratory failure Bilateral pulmonary infiltrates Suspect pulmonary edema R/O PNA NSTEMI - ? Stuttering ischemia LBBB (? New) AKI RUQ pain - suspect anginal equivalent   PLAN/REC: Continue ICU level of care Supplemental O2 to maintain SpO2 > 90% PRN BiPAP Diuresis as permitted by BP and renal function Cont to cycle cardiac markers Check BNP Echocardiogram ordered Cardiology following Monitor BMET  intermittently Monitor I/Os Correct electrolytes as indicated Will need to address goals of care as database is completed  Poor candidate for mechanical ventilation or ACLS  Family reportedly conflicted presently but patient is expressing desire to be DNR Cont empiric abx for now - consider DC 05/23 if evidence for PNA is not more compelling  CCM time 35 mins - independent of time spent by NP Kathrin GreathouseBlakeney  Apostolos Blagg, MD PCCM service Mobile [phone] Pager [phone] 07/04/2016 11:21 AM

## 2016-07-04 NOTE — Progress Notes (Signed)
Transported pt to ICU on Bipap without incident. Pt remains on Bipap at this time and is tol ok.

## 2016-07-04 NOTE — Progress Notes (Signed)
ANTICOAGULATION CONSULT NOTE - Initial Consult  Pharmacy Consult for heparin dosing Indication: chest pain/ACS  Allergies  Allergen Reactions  . Codeine Nausea And Vomiting  . Penicillins Hives and Other (See Comments)    Has patient had a PCN reaction causing immediate rash, facial/tongue/throat swelling, SOB or lightheadedness with hypotension: Unknown Has patient had a PCN reaction causing severe rash involving mucus membranes or skin necrosis: Unknown Has patient had a PCN reaction that required hospitalization: Unknown Has patient had a PCN reaction occurring within the last 10 years: No If all of the above answers are "NO", then may proceed with Cephalosporin use.   . Sulfa Antibiotics Other (See Comments)    Reaction: unknown    Patient Measurements: Height: 5\' 5"  (165.1 cm) Weight: 140 lb (63.5 kg) IBW/kg (Calculated) : 57 Heparin Dosing Weight: 63.5 kg  Vital Signs: Temp: 98.4 F (36.9 C) (05/21 1830) Temp Source: Oral (05/21 1830) BP: 142/86 (05/22 0130) Pulse Rate: 110 (05/22 0145)  Labs:  Recent Labs  07/19/16 1833 07/19/16 2226  HGB 13.3  --   HCT 40.0  --   PLT 224  --   CREATININE 1.01*  --   TROPONINI  --  2.24*    Estimated Creatinine Clearance: 32.6 mL/min (A) (by C-G formula based on SCr of 1.01 mg/dL (H)).   Medical History: Past Medical History:  Diagnosis Date  . Abdominal pain, right upper quadrant   . At risk for falls   . Atherosclerosis of native arteries of extremities with rest pain, unspecified extremity (HCC)   . Candidiasis of breast   . DDD (degenerative disc disease), lumbar   . Dizziness   . Elevated BP   . Head injury   . Hypertension   . Obesity   . Pain of left lower extremity   . Pelvic pain in female   . Synovial cyst of popliteal space   . Urinary frequency   . Urinary incontinence   . Urinary tract infection   . Vaginal atrophy   . Vaginal itching   . Wound of left leg     Medications:  Scheduled:  .  heparin  3,800 Units Intravenous Once    Assessment: Pt. Admitted w/ abdominal pain and found to have elevated trops 2.24 is being started on heparin drip.  Goal of Therapy:  Heparin level 0.3-0.7 units/ml Monitor platelets by anticoagulation protocol: Yes   Plan:  Give 3800 units bolus x 1  Will start heparin drip @ 800 units/hr and will check HL @ 1100. Will monitor CBC and s/sx of bleeding, CBC currently stable Baseline labs ordered.  Thank you for this consult.  Thomasene Rippleavid Rajah Lamba, PharmD, BCPS Clinical Pharmacist 07/04/2016

## 2016-07-04 NOTE — Progress Notes (Signed)
ANTICOAGULATION CONSULT NOTE   Pharmacy Consult for heparin dosing Indication: chest pain/ACS  Pharmacy consulted for heparin drip management for 83 yo female admitted with elevated troponins. Patient initiated on heparin 800 units/hr.   Goal of Therapy:  Heparin level 0.3-0.7 units/ml Monitor platelets by anticoagulation protocol: Yes   Plan:  Will continue with current rate of 800 units/hr. Will obtain confirmatory level at 2000.    5/22: HL @ 20:43 = 0.55 Will continue this pt on current rate and recheck HL on 5/23 with AM labs.   Allergies  Allergen Reactions  . Codeine Nausea And Vomiting  . Penicillins Hives and Other (See Comments)    Has patient had a PCN reaction causing immediate rash, facial/tongue/throat swelling, SOB or lightheadedness with hypotension: Unknown Has patient had a PCN reaction causing severe rash involving mucus membranes or skin necrosis: Unknown Has patient had a PCN reaction that required hospitalization: Unknown Has patient had a PCN reaction occurring within the last 10 years: No If all of the above answers are "NO", then may proceed with Cephalosporin use.   . Sulfa Antibiotics Other (See Comments)    Reaction: unknown    Patient Measurements: Height: 5\' 5"  (165.1 cm) Weight: 148 lb 13 oz (67.5 kg) IBW/kg (Calculated) : 57 Heparin Dosing Weight: 63.5 kg  Vital Signs: Temp: 98.5 F (36.9 C) (05/22 1900) Temp Source: Oral (05/22 1900) BP: 106/57 (05/22 2000) Pulse Rate: 87 (05/22 2000)  Labs:  Recent Labs  2016-09-15 1833  07/04/16 0157 07/04/16 0627 07/04/16 1048 07/04/16 1408 07/04/16 2043  HGB 13.3  --   --  13.5  --   --   --   HCT 40.0  --   --  40.4  --   --   --   PLT 224  --   --  243  --   --   --   APTT  --   --   --  91*  --   --   --   LABPROT  --   --   --  15.0  --   --   --   INR  --   --   --  1.17  --   --   --   HEPARINUNFRC  --   --   --   --  0.57  --  0.55  CREATININE 1.01*  --   --  1.10*  --   --   --    TROPONINI  --   < > 1.85* 2.25*  --  3.17*  --   < > = values in this interval not displayed.  Estimated Creatinine Clearance: 30 mL/min (A) (by C-G formula based on SCr of 1.1 mg/dL (H)).   Medical History: Past Medical History:  Diagnosis Date  . Abdominal pain, right upper quadrant   . At risk for falls   . Atherosclerosis of native arteries of extremities with rest pain, unspecified extremity (HCC)   . Candidiasis of breast   . DDD (degenerative disc disease), lumbar   . Dizziness   . Elevated BP   . Head injury   . Hypertension   . Obesity   . Pain of left lower extremity   . Pelvic pain in female   . Synovial cyst of popliteal space   . Urinary frequency   . Urinary incontinence   . Urinary tract infection   . Vaginal atrophy   . Vaginal itching   . Wound of left  leg     Medications:  Scheduled:  . chlorhexidine  15 mL Mouth Rinse BID  . [START ON 07/05/2016] famotidine  20 mg Oral Daily  . mouth rinse  15 mL Mouth Rinse q12n4p  . metoprolol tartrate  5 mg Intravenous Q6H     Pharmacy will continue to monitor and adjust per consult.   Harveen Flesch D 07/04/2016

## 2016-07-04 NOTE — Progress Notes (Signed)
Pharmacy Antibiotic Note  Tiffany Jenkins is a 83 y.o. female admitted on 10/31/2016 with UTI.  Pharmacy has been consulted for ceftriaxone dosing.  Plan: Will start ceftriaxone 1g IV daily   Height: 5\' 5"  (165.1 cm) Weight: 148 lb 13 oz (67.5 kg) IBW/kg (Calculated) : 57  Temp (24hrs), Avg:98 F (36.7 C), Min:97.5 F (36.4 C), Max:98.4 F (36.9 C)   Recent Labs Lab 03/12/2016 1833  WBC 11.0  CREATININE 1.01*    Estimated Creatinine Clearance: 32.6 mL/min (A) (by C-G formula based on SCr of 1.01 mg/dL (H)).    Allergies  Allergen Reactions  . Codeine Nausea And Vomiting  . Penicillins Hives and Other (See Comments)    Has patient had a PCN reaction causing immediate rash, facial/tongue/throat swelling, SOB or lightheadedness with hypotension: Unknown Has patient had a PCN reaction causing severe rash involving mucus membranes or skin necrosis: Unknown Has patient had a PCN reaction that required hospitalization: Unknown Has patient had a PCN reaction occurring within the last 10 years: No If all of the above answers are "NO", then may proceed with Cephalosporin use.   . Sulfa Antibiotics Other (See Comments)    Reaction: unknown     Thank you for allowing pharmacy to be a part of this patient's care.  Thomasene Rippleavid Breah Joa, PharmD, BCPS Clinical Pharmacist 07/04/2016

## 2016-07-04 NOTE — ED Notes (Signed)
MD Zenda AlpersWebster at bedside with update at this time.

## 2016-07-04 NOTE — ED Notes (Signed)
Pt's O2 sats noted to be in 82-83% on 1L via [place]; pt's position changed, pt moved up in bed, head of the bed raised up and O2 increased to 4L via [place]; pt's O2 sats noted to increase to 94%; MD and RN notified.

## 2016-07-04 NOTE — ED Notes (Signed)
Family states pt increased WOB began Thursday.

## 2016-07-04 NOTE — Progress Notes (Signed)
ANTICOAGULATION CONSULT NOTE   Pharmacy Consult for heparin dosing Indication: chest pain/ACS  Pharmacy consulted for heparin drip management for 83 yo female admitted with elevated troponins. Patient initiated on heparin 800 units/hr.   Goal of Therapy:  Heparin level 0.3-0.7 units/ml Monitor platelets by anticoagulation protocol: Yes   Plan:  Will continue with current rate of 800 units/hr. Will obtain confirmatory level at 2000.    Allergies  Allergen Reactions  . Codeine Nausea And Vomiting  . Penicillins Hives and Other (See Comments)    Has patient had a PCN reaction causing immediate rash, facial/tongue/throat swelling, SOB or lightheadedness with hypotension: Unknown Has patient had a PCN reaction causing severe rash involving mucus membranes or skin necrosis: Unknown Has patient had a PCN reaction that required hospitalization: Unknown Has patient had a PCN reaction occurring within the last 10 years: No If all of the above answers are "NO", then may proceed with Cephalosporin use.   . Sulfa Antibiotics Other (See Comments)    Reaction: unknown    Patient Measurements: Height: 5\' 5"  (165.1 cm) Weight: 148 lb 13 oz (67.5 kg) IBW/kg (Calculated) : 57 Heparin Dosing Weight: 63.5 kg  Vital Signs: Temp: 97 F (36.1 C) (05/22 0800) Temp Source: Axillary (05/22 0800) BP: 111/67 (05/22 1300) Pulse Rate: 96 (05/22 1300)  Labs:  Recent Labs  07/13/2016 1833 06/25/2016 2226 07/04/16 0157 07/04/16 0627 07/04/16 1048  HGB 13.3  --   --  13.5  --   HCT 40.0  --   --  40.4  --   PLT 224  --   --  243  --   APTT  --   --   --  91*  --   LABPROT  --   --   --  15.0  --   INR  --   --   --  1.17  --   HEPARINUNFRC  --   --   --   --  0.57  CREATININE 1.01*  --   --  1.10*  --   TROPONINI  --  2.24* 1.85* 2.25*  --     Estimated Creatinine Clearance: 30 mL/min (A) (by C-G formula based on SCr of 1.1 mg/dL (H)).   Medical History: Past Medical History:  Diagnosis  Date  . Abdominal pain, right upper quadrant   . At risk for falls   . Atherosclerosis of native arteries of extremities with rest pain, unspecified extremity (HCC)   . Candidiasis of breast   . DDD (degenerative disc disease), lumbar   . Dizziness   . Elevated BP   . Head injury   . Hypertension   . Obesity   . Pain of left lower extremity   . Pelvic pain in female   . Synovial cyst of popliteal space   . Urinary frequency   . Urinary incontinence   . Urinary tract infection   . Vaginal atrophy   . Vaginal itching   . Wound of left leg     Medications:  Scheduled:  . chlorhexidine  15 mL Mouth Rinse BID  . LORazepam      . mouth rinse  15 mL Mouth Rinse q12n4p  . metoprolol tartrate  5 mg Intravenous Q6H     Pharmacy will continue to monitor and adjust per consult.   MLS 07/04/2016

## 2016-07-04 NOTE — Progress Notes (Signed)
[facility] - [place] at [facility]   PATIENT NAME: Tiffany Jenkins    MR#:  [id number]  DATE OF BIRTH:  02-08-1924  SUBJECTIVE:  CHIEF COMPLAINT:   Chief Complaint  Patient presents with  . Abdominal Pain   Still cough and shortness of breath. Off BiPAP, on high flow oxygen REVIEW OF SYSTEMS:  Review of Systems  Constitutional: Positive for malaise/fatigue. Negative for chills and fever.  HENT: Positive for hearing loss. Negative for congestion and sinus pain.   Eyes: Negative for blurred vision and double vision.  Respiratory: Positive for cough and shortness of breath. Negative for hemoptysis, wheezing and stridor.   Cardiovascular: Negative for chest pain, palpitations and leg swelling.  Gastrointestinal: Negative for abdominal pain, blood in stool, diarrhea, melena, nausea and vomiting.  Genitourinary: Negative for dysuria and hematuria.  Musculoskeletal: Negative for back pain.  Skin: Negative for itching and rash.  Neurological: Positive for weakness. Negative for dizziness, focal weakness, loss of consciousness and headaches.  Psychiatric/Behavioral: Negative for depression. The patient is not nervous/anxious.     DRUG ALLERGIES:   Allergies  Allergen Reactions  . Codeine Nausea And Vomiting  . Penicillins Hives and Other (See Comments)    Has patient had a PCN reaction causing immediate rash, facial/tongue/throat swelling, SOB or lightheadedness with hypotension: Unknown Has patient had a PCN reaction causing severe rash involving mucus membranes or skin necrosis: Unknown Has patient had a PCN reaction that required hospitalization: Unknown Has patient had a PCN reaction occurring within the last 10 years: No If all of the above answers are "NO", then may proceed with Cephalosporin use.   . Sulfa Antibiotics Other (See Comments)    Reaction: unknown   VITALS:  Blood pressure (!) 97/57, pulse 79, temperature 98.1 F (36.7 C), temperature source  Oral, resp. rate (!) 31, height 5\' 5"  (1.651 m), weight 148 lb 13 oz (67.5 kg), SpO2 96 %. PHYSICAL EXAMINATION:  Physical Exam  Constitutional: She is oriented to person, place, and time.  Critical and fragile looking  HENT:  Head: Normocephalic.  Mouth/Throat: Oropharynx is clear and moist.  Eyes: Conjunctivae and EOM are normal. No scleral icterus.  Neck: Normal range of motion. Neck supple. No JVD present. No tracheal deviation present.  Cardiovascular: Normal rate, regular rhythm and normal heart sounds.  Exam reveals no gallop.   No murmur heard. Pulmonary/Chest: Effort normal. No respiratory distress. She has no wheezes. She has rales.  Abdominal: Soft. Bowel sounds are normal. She exhibits no distension. There is no tenderness.  Musculoskeletal: Normal range of motion. She exhibits no tenderness.  Neurological: She is alert and oriented to person, place, and time. No cranial nerve deficit.  Skin: No rash noted. No erythema.  Psychiatric: Affect normal.   LABORATORY PANEL:  Female CBC  Recent Labs Lab 07/04/16 0627  WBC 17.3*  HGB 13.5  HCT 40.4  PLT 243   ------------------------------------------------------------------------------------------------------------------ Chemistries   Recent Labs Lab Aug 01, 2016 1833 07/04/16 0627  NA 134* 134*  K 4.5 4.3  CL 105 103  CO2 20* 21*  GLUCOSE 113* 144*  BUN 15 19  CREATININE 1.01* 1.10*  CALCIUM 9.0 8.5*  AST 41  --   ALT 19  --   ALKPHOS 58  --   BILITOT 1.3*  --    RADIOLOGY:  Dg Chest Port 1 View  Result Date: 07/04/2016 CLINICAL DATA:  Respiratory failure and abdominal pain. History of hypertension. EXAM: PORTABLE CHEST 1 VIEW COMPARISON:  Five  hundred twenty 1,018 FINDINGS: Shallow inspiration. Cardiac enlargement. Bilateral perihilar airspace infiltrates, developing since previous study. Blunting of the left costophrenic angle suggesting a small effusion. No pneumothorax. Calcification of the aorta. IMPRESSION:  Developing perihilar infiltrates since previous study. Small left pleural effusion. Electronically Signed   By: Burman Nieves M.D.   On: 07/04/2016 06:53   Dg Chest Portable 1 View  Result Date: Jul 24, 2016 CLINICAL DATA:  Epigastric pain and shortness of breath EXAM: PORTABLE CHEST 1 VIEW COMPARISON:  04/24/2013 FINDINGS: Shallow inspiration with elevation of the right hemidiaphragm. Blunting of the left costophrenic angle suggesting a small effusion. No consolidation or airspace disease in the lungs. No pneumothorax. Mild cardiac enlargement with normal pulmonary vascularity. Right hilum is somewhat prominent but this is similar to previous study and likely represents prominent vascularity. Calcification and torsion of the aorta. IMPRESSION: Shallow inspiration. Cardiac enlargement. No evidence of active pulmonary disease. Electronically Signed   By: Burman Nieves M.D.   On: 07-24-2016 23:49   Ct Angio Chest/abd/pel For Dissection W And/or Wo Contrast  Result Date: 07/04/2016 CLINICAL DATA:  Chest and abdomen pain. Right upper quadrant and epigastric pain radiating to the back. Started last night. Gallbladder spasms. Nausea. Difficulty breathing. EXAM: CT ANGIOGRAPHY CHEST, ABDOMEN AND PELVIS TECHNIQUE: Multidetector CT imaging through the chest, abdomen and pelvis was performed using the standard protocol during bolus administration of intravenous contrast. Multiplanar reconstructed images and MIPs were obtained and reviewed to evaluate the vascular anatomy. CONTRAST:  100 mL Isovue 370 COMPARISON:  CT abdomen and pelvis 04/25/2013 FINDINGS: CTA CHEST FINDINGS Cardiovascular: Unenhanced images of the aorta demonstrates scattered calcifications. No evidence of intramural hematoma. Coronary artery calcifications are present. Gas within venous structures likely arises from intravenous injections. Images obtained during arterial phase after contrast injection demonstrate good opacification of the  thoracic aorta. No aortic aneurysm or dissection. Great vessel origins are patent. Central pulmonary arteries are well opacified. No evidence for significant pulmonary embolus. Normal heart size. No pericardial effusion. Mediastinum/Nodes: No significant lymphadenopathy in the chest. Esophagus is decompressed. Lungs/Pleura: Evaluation of lungs is limited due to respiratory motion artifact. There is diffuse consolidation throughout the lungs. This may be due to multifocal pneumonia, edema, or ARDS. Airways are patent. Small bilateral pleural effusions. No pneumothorax. Musculoskeletal: Degenerative changes in the spine. No destructive bone lesions. Review of the MIP images confirms the above findings. CTA ABDOMEN AND PELVIS FINDINGS VASCULAR Aorta: Diffuse vascular calcifications. Normal caliber abdominal aorta. No evidence of aneurysm or dissection. No significant stenosis. Celiac: Patent without evidence of aneurysm, dissection, vasculitis or significant stenosis. SMA: Patent without evidence of aneurysm, dissection, vasculitis or significant stenosis. Renals: Both renal arteries are patent without evidence of aneurysm, dissection, vasculitis, fibromuscular dysplasia or significant stenosis. IMA: Patent without evidence of aneurysm, dissection, vasculitis or significant stenosis. Inflow: Patent without evidence of aneurysm, dissection, vasculitis or significant stenosis. Veins: No obvious venous abnormality within the limitations of this arterial phase study. Review of the MIP images confirms the above findings. NON-VASCULAR Hepatobiliary: Multiple low-attenuation lesions throughout the liver. The largest measures about 4 cm maximal diameter. Characterization of these lesions is difficult due to earlier arterial phase and motion artifact. Lesions likely represent cysts. No change since previous study. Gallbladder is contracted. No bile duct dilatation. Pancreas: Unremarkable. No pancreatic ductal dilatation or  surrounding inflammatory changes. Spleen: Normal in size without focal abnormality. Adrenals/Urinary Tract: Adrenal glands are unremarkable. Kidneys are normal, without renal calculi, focal lesion, or hydronephrosis. Bladder is unremarkable.PE Stomach/Bowel: Stomach, small  bowel, and colon are not abnormally distended. Scattered diverticula in the colon. No evidence of diverticulitis. Appendix is not identified. Lymphatic: No significant lymphadenopathy. Reproductive: Status post hysterectomy. No adnexal masses. Other: No free air or free fluid is identified in the abdomen. Abdominal wall musculature appears intact. Examination is limited due to motion artifact. Musculoskeletal: Degenerative changes throughout the spine. No destructive bone lesions. Review of the MIP images confirms the above findings. IMPRESSION: Examination is technically limited due to motion artifact. Diffuse vascular calcification. No evidence of aneurysm or dissection of the thoracic or abdominal aorta. Major vessels appear patent. Diffuse airspace consolidation throughout both lungs with small pleural effusions. This could be due to multifocal pneumonia, edema, or ARDS. No acute process demonstrated in the abdomen or pelvis. Electronically Signed   By: Burman NievesWilliam  Stevens M.D.   On: 07/04/2016 04:16   [place] Abdomen Limited Ruq  Result Date: 07/04/2016 CLINICAL DATA:  Acute onset of generalized abdominal pain. Initial encounter. EXAM: US ABDOMEN LIMITED - RIGHT UPPER QUADRANT COMPARISON:  CT abdomen and pelvis performed 04/25/2013 FINDINGS: Gallbladder: No gallstones or wall thickening visualized. No sonographic Murphy sign noted by sonographer. Common bile duct: Diameter: 0.2 cm, within normal limits in caliber. Liver: Multiple hepatic cysts are again noted, measuring up to 3.7 cm in size. Within normal limits in parenchymal echogenicity. A small right pleural effusion is noted. IMPRESSION: 1. No acute abnormality at the right upper quadrant.  2. Small right pleural effusion noted. 3. Multiple hepatic cysts again noted. Electronically Signed   By: Roanna RaiderJeffery  Chang M.D.   On: 07/04/2016 00:51   ASSESSMENT AND PLAN:   Acute hypoxemic respiratory failure due to Bilateral pulmonary infiltrates or pulmonary edema, R/O PNA The patient is off BiPAP, but on high flow oxygen. Continue nebulizer and antibiotics. Follow-up CBC and cultures. Cont empiric abx for now - consider DC 05/23 if evidence for PNA is not more compelling per Dr. Sung AmabileSimonds.  NSTEMI, continue heparin drip and a follow-up cardiology consult. Follow-up echocardiogram.  HTN (hypertension) - stable, continue home meds  All the records are reviewed and case discussed with Care Management/Social Worker. Management plans discussed with the patient, her Husband, son, daughter and granddaughter and they are in agreement.  CODE STATUS: Full Code  TOTAL TIME TAKING CARE OF THIS PATIENT: 43 minutes.   More than 50% of the time was spent in counseling/coordination of care: YES  POSSIBLE D/C IN 3-4 DAYS, DEPENDING ON CLINICAL CONDITION.   Shaune Pollackhen, Atina Feeley M.D on 07/04/2016 at 3:28 PM  Between 7am to 6pm - Pager - [phone]  After 6pm go to [email or web address] - Social research officer, governmentpassword [email or web address]  [facility] [place] [facility]  Office  [phone]  CC: Primary care physician; Corky DownsMasoud, Javed, MD  Note: This dictation was prepared with Dragon dictation along with smaller phrase technology. Any transcriptional errors that result from this process are unintentional.

## 2016-07-04 NOTE — Progress Notes (Signed)
eLink Physician-Brief Progress Note Patient Name: Sharyne PeachLouise Squires Cala DOB: 01/04/1924 MRN: [id number]   Date of Service  07/04/2016  HPI/Events of Note  Admitted for NSTEMI. Currently on both Lovenox and Heparin IV infusion.   eICU Interventions  Will D/C Lovenox.     Intervention Category Major Interventions: Other:  Lenell AntuSommer,Arrin Ishler Eugene 07/04/2016, 5:37 AM

## 2016-07-04 NOTE — Progress Notes (Signed)
CONCERNING: IV to Oral Route Change Policy  RECOMMENDATION: This patient is receiving famotidine by the intravenous route.  Based on criteria approved by the Pharmacy and Therapeutics Committee, the intravenous medication(s) is/are being converted to the equivalent oral dose form(s).   DESCRIPTION: These criteria include:  The patient is eating (either orally or via tube) and/or has been taking other orally administered medications for a least 24 hours  The patient has no evidence of active gastrointestinal bleeding or impaired GI absorption (gastrectomy, short bowel, patient on TNA or NPO).  If you have questions about this conversion, please contact the Pharmacy Department  []   [phone]( [phone] )  [facility] [x]   [phone]( [phone] )  [facility] []   [phone]( [phone] )  [facility] []   [phone]( [phone] )  [facility] []   [phone]( [phone]  [facility]   Chloris Marcoux L, [facility]RPH 07/04/2016 1:54 PM

## 2016-07-04 NOTE — ED Notes (Signed)
Pt in CT at this time.

## 2016-07-04 NOTE — ED Notes (Signed)
Pt in US at this time 

## 2016-07-04 NOTE — ED Notes (Signed)
[facility]

## 2016-07-04 NOTE — H&P (Signed)
[facility] [place] [facility]   PATIENT NAME: Tiffany GoingLouise Jenkins    MR#:  [id number]  DATE OF BIRTH:  03/26/1923  DATE OF ADMISSION:  2016-05-12  PRIMARY CARE PHYSICIAN: Corky DownsMasoud, Javed, MD   REQUESTING/REFERRING PHYSICIAN: Zenda AlpersWebster, MD  CHIEF COMPLAINT:   Chief Complaint  Patient presents with  . Abdominal Pain    HISTORY OF PRESENT ILLNESS:  Tiffany Jenkins  is a 83 y.o. female who presents with 2 days of epigastric right upper quadrant abdominal pain. This radiated around to the patient's back. She states that she has had gallbladder spasm in the past, and thought that that she had again. However, on evaluation here in the ED she was found to have a troponin of 2.2, without any abnormality in her right upper quadrant ULTRASOUND. Hospitalists were called for admission for non-STEMI  PAST MEDICAL HISTORY:   Past Medical History:  Diagnosis Date  . Abdominal pain, right upper quadrant   . At risk for falls   . Atherosclerosis of native arteries of extremities with rest pain, unspecified extremity (HCC)   . Candidiasis of breast   . DDD (degenerative disc disease), lumbar   . Dizziness   . Elevated BP   . Head injury   . Hypertension   . Obesity   . Pain of left lower extremity   . Pelvic pain in female   . Synovial cyst of popliteal space   . Urinary frequency   . Urinary incontinence   . Urinary tract infection   . Vaginal atrophy   . Vaginal itching   . Wound of left leg     PAST SURGICAL HISTORY:   Past Surgical History:  Procedure Laterality Date  . ABDOMINAL HYSTERECTOMY    . APPENDECTOMY    . BALLOON DILATION N/A 07/29/2015   Procedure: BALLOON DILATION;  Surgeon: Scot Junobert T Elliott, MD;  [facility];  Service: Endoscopy;  Laterality: N/A;  . bladder track     x 2  . ESOPHAGOGASTRODUODENOSCOPY (EGD) WITH PROPOFOL N/A 07/29/2015   Procedure: ESOPHAGOGASTRODUODENOSCOPY (EGD) WITH PROPOFOL;  Surgeon: Scot Junobert T Elliott, MD;   [facility];  Service: Endoscopy;  Laterality: N/A;  . RECTOCELE REPAIR    . TONSILLECTOMY      SOCIAL HISTORY:   Social History  Substance Use Topics  . Smoking status: Never Smoker  . Smokeless tobacco: Not on file  . Alcohol use No    FAMILY HISTORY:   Family History  Problem Relation Age of Onset  . Prostate cancer Son   . Kidney disease Mother   . Breast cancer Sister     DRUG ALLERGIES:   Allergies  Allergen Reactions  . Codeine Nausea And Vomiting  . Penicillins Hives and Other (See Comments)    Has patient had a PCN reaction causing immediate rash, facial/tongue/throat swelling, SOB or lightheadedness with hypotension: Unknown Has patient had a PCN reaction causing severe rash involving mucus membranes or skin necrosis: Unknown Has patient had a PCN reaction that required hospitalization: Unknown Has patient had a PCN reaction occurring within the last 10 years: No If all of the above answers are "NO", then may proceed with Cephalosporin use.   . Sulfa Antibiotics Other (See Comments)    Reaction: unknown    MEDICATIONS AT HOME:   Prior to Admission medications   Medication Sig Start Date End Date Taking? Authorizing Provider  amLODipine (NORVASC) 5 MG tablet Reported on 07/07/2015 05/21/15  Yes [provider]  REVIEW OF SYSTEMS:  Review of Systems  Constitutional: Positive for malaise/fatigue. Negative for chills, fever and weight loss.  HENT: Negative for ear pain, hearing loss and tinnitus.   Eyes: Negative for blurred vision, double vision, pain and redness.  Respiratory: Negative for cough, hemoptysis and shortness of breath.   Cardiovascular: Negative for chest pain, palpitations, orthopnea and leg swelling.  Gastrointestinal: Positive for abdominal pain. Negative for constipation, diarrhea, nausea and vomiting.  Genitourinary: Negative for dysuria, frequency and hematuria.  Musculoskeletal: Negative for back pain, joint pain  and neck pain.  Skin:       No acne, rash, or lesions  Neurological: Negative for dizziness, tremors, focal weakness and weakness.  Endo/Heme/Allergies: Negative for polydipsia. Does not bruise/bleed easily.  Psychiatric/Behavioral: Negative for depression. The patient is not nervous/anxious and does not have insomnia.      VITAL SIGNS:   Vitals:   06/24/2016 2300 07/04/16 0026 07/04/16 0027 07/04/16 0030  BP: 135/70   (!) 159/82  Pulse: (!) 103 (!) 117 (!) 125 (!) 114  Resp: (!) 25 (!) 23 (!) 22 (!) 27  Temp:      TempSrc:      SpO2: 92% (!) 83% (!) 84% 94%  Weight:      Height:       Wt Readings from Last 3 Encounters:  06/19/2016 63.5 kg (140 lb)  07/29/15 67.1 kg (148 lb)  07/07/15 66.5 kg (146 lb 9.6 oz)    PHYSICAL EXAMINATION:  Physical Exam  Vitals reviewed. Constitutional: She is oriented to person, place, and time. She appears well-developed and well-nourished. No distress.  HENT:  Head: Normocephalic and atraumatic.  Mouth/Throat: Oropharynx is clear and moist.  Eyes: Conjunctivae and EOM are normal. Pupils are equal, round, and reactive to light. No scleral icterus.  Neck: Normal range of motion. Neck supple. No JVD present. No thyromegaly present.  Cardiovascular: Regular rhythm and intact distal pulses.  Exam reveals no gallop and no friction rub.   No murmur heard. Tachycardic  Respiratory: Effort normal. No respiratory distress. She has no wheezes. She has rales.  GI: Soft. Bowel sounds are normal. She exhibits no distension. There is no tenderness.  Musculoskeletal: Normal range of motion. She exhibits no edema.  No arthritis, no gout  Lymphadenopathy:    She has no cervical adenopathy.  Neurological: She is alert and oriented to person, place, and time. No cranial nerve deficit.  No dysarthria, no aphasia  Skin: Skin is warm and dry. No rash noted. No erythema.  Psychiatric: She has a normal mood and affect. Her behavior is normal. Judgment and thought  content normal.    LABORATORY PANEL:   CBC  Recent Labs Lab 06/13/2016 1833  WBC 11.0  HGB 13.3  HCT 40.0  PLT 224   ------------------------------------------------------------------------------------------------------------------  Chemistries   Recent Labs Lab 07/07/2016 1833  NA 134*  K 4.5  CL 105  CO2 20*  GLUCOSE 113*  BUN 15  CREATININE 1.01*  CALCIUM 9.0  AST 41  ALT 19  ALKPHOS 58  BILITOT 1.3*   ------------------------------------------------------------------------------------------------------------------  Cardiac Enzymes  Recent Labs Lab 06/15/2016 2226  TROPONINI 2.24*   ------------------------------------------------------------------------------------------------------------------  RADIOLOGY:  Dg Chest Portable 1 View  Result Date: 06/30/2016 CLINICAL DATA:  Epigastric pain and shortness of breath EXAM: PORTABLE CHEST 1 VIEW COMPARISON:  04/24/2013 FINDINGS: Shallow inspiration with elevation of the right hemidiaphragm. Blunting of the left costophrenic angle suggesting a small effusion. No consolidation or airspace disease in the lungs.  No pneumothorax. Mild cardiac enlargement with normal pulmonary vascularity. Right hilum is somewhat prominent but this is similar to previous study and likely represents prominent vascularity. Calcification and torsion of the aorta. IMPRESSION: Shallow inspiration. Cardiac enlargement. No evidence of active pulmonary disease. Electronically Signed   By: Burman Nieves M.D.   On: 07/16/16 23:49   US Abdomen Limited Ruq  Result Date: 07/04/2016 CLINICAL DATA:  Acute onset of generalized abdominal pain. Initial encounter. EXAM: US ABDOMEN LIMITED - RIGHT UPPER QUADRANT COMPARISON:  CT abdomen and pelvis performed 04/25/2013 FINDINGS: Gallbladder: No gallstones or wall thickening visualized. No sonographic Murphy sign noted by sonographer. Common bile duct: Diameter: 0.2 cm, within normal limits in caliber. Liver:  Multiple hepatic cysts are again noted, measuring up to 3.7 cm in size. Within normal limits in parenchymal echogenicity. A small right pleural effusion is noted. IMPRESSION: 1. No acute abnormality at the right upper quadrant. 2. Small right pleural effusion noted. 3. Multiple hepatic cysts again noted. Electronically Signed   By: Roanna Raider M.D.   On: 07/04/2016 00:51    EKG:   Orders placed or performed during the hospital encounter of July 16, 2016  . ED EKG  . ED EKG  . EKG 12-Lead  . EKG 12-Lead    IMPRESSION AND PLAN:  Principal Problem:   NSTEMI (non-ST elevated myocardial infarction) (HCC) - IV heparin, trend cardiac enzymes, echocardiogram and cardiology consult in the morning, control heart rate tonight with beta blocker Active Problems:   HTN (hypertension) - stable, continue home meds   Overactive bladder - continue home meds  All the records are reviewed and case discussed with ED provider. Management plans discussed with the patient and/or family.  DVT PROPHYLAXIS: SubQ lovenox  GI PROPHYLAXIS: None  ADMISSION STATUS: Inpatient  CODE STATUS: Full Code Status History    This patient does not have a recorded code status. Please follow your organizational policy for patients in this situation.    Advance Directive Documentation     Most Recent Value  Type of Advance Directive  Healthcare Power of Attorney  Pre-existing out of facility DNR order (yellow form or pink MOST form)  -  "MOST" Form in Place?  -      TOTAL TIME TAKING CARE OF THIS PATIENT: 45 minutes.   Deetta Siegmann FIELDING 07/04/2016, 1:01 AM  [facility]  Office  [phone]  CC: Primary care physician; Corky Downs, MD  Note:  This document was prepared using Dragon voice recognition software and may include unintentional dictation errors.

## 2016-07-04 NOTE — Progress Notes (Signed)
83 Y/O female admitted with an NSTEMI  And acute hypoxic respiratory failure secondary to multifocal pneumonia and pulmonary edema. Transitioned from BiPAP to High flow [place]. SPO2 90-95%. Sleeping comfortably and offers no complaints.  On exam, patient exhibits a normal respiratory pattern with bilateral breath sounds, no wheezing, fine crackles in the bases, + 1 non-pitting edema, abdomen without an palpable masses Plan Continue IV diuresis, heparin gtte,  nitroglycerin, prn tylenol for headache and transfer to telemetry Notify PCCM if dyspneic, hypoxic and has chest pain  Bela Bonaparte S. [facility] ANP-BC Pulmonary and Critical Care Medicine [facility] Pager [phone] or [phone]

## 2016-07-04 NOTE — Progress Notes (Signed)
Patient taken off BIPAP and placed on 4lpm [place] .Marland Kitchen. Patient tolerating well at this time saturations 93% HR 81 RR 18.  Will continue to monitor.

## 2016-07-04 NOTE — ED Notes (Signed)
Admitting MD Willis at bedside at this time.  

## 2016-07-05 ENCOUNTER — Encounter: Payer: Self-pay | Admitting: *Deleted

## 2016-07-05 ENCOUNTER — Inpatient Hospital Stay
Admit: 2016-07-05 | Discharge: 2016-07-05 | Disposition: A | Discharge status: Home / Self Care | Payer: Medicare Other | Attending: Adult Health | Admitting: Adult Health

## 2016-07-05 ENCOUNTER — Inpatient Hospital Stay: Payer: Medicare Other

## 2016-07-05 LAB — URINE CULTURE

## 2016-07-05 LAB — BRAIN NATRIURETIC PEPTIDE
B NATRIURETIC PEPTIDE 5: 2675 pg/mL — AB (ref 0.0–100.0)
B Natriuretic Peptide: 1596 pg/mL — ABNORMAL HIGH (ref 0.0–100.0)

## 2016-07-05 LAB — BASIC METABOLIC PANEL
ANION GAP: 9 (ref 5–15)
BUN: 27 mg/dL — ABNORMAL HIGH (ref 6–20)
CHLORIDE: 102 mmol/L (ref 101–111)
CO2: 24 mmol/L (ref 22–32)
Calcium: 8.3 mg/dL — ABNORMAL LOW (ref 8.9–10.3)
Creatinine, Ser: 1.2 mg/dL — ABNORMAL HIGH (ref 0.44–1.00)
GFR, EST AFRICAN AMERICAN: 44 mL/min — AB (ref >60)
GFR, EST NON AFRICAN AMERICAN: 38 mL/min — AB (ref >60)
Glucose, Bld: 89 mg/dL (ref 65–99)
POTASSIUM: 3.9 mmol/L (ref 3.5–5.1)
SODIUM: 135 mmol/L (ref 135–145)

## 2016-07-05 LAB — GLUCOSE, CAPILLARY: Glucose-Capillary: 127 mg/dL — ABNORMAL HIGH (ref 65–99)

## 2016-07-05 LAB — CBC
HEMATOCRIT: 37.8 % (ref 35.0–47.0)
HEMOGLOBIN: 12.7 g/dL (ref 12.0–16.0)
MCH: 30.8 pg (ref 26.0–34.0)
MCHC: 33.7 g/dL (ref 32.0–36.0)
MCV: 91.4 fL (ref 80.0–100.0)
Platelets: 220 10*3/uL (ref 150–440)
RBC: 4.14 MIL/uL (ref 3.80–5.20)
RDW: 13.4 % (ref 11.5–14.5)
WBC: 11.5 10*3/uL — AB (ref 3.6–11.0)

## 2016-07-05 LAB — TROPONIN I: TROPONIN I: 2.03 ng/mL — AB (ref <0.03)

## 2016-07-05 LAB — PROCALCITONIN: PROCALCITONIN: 0.28 ng/mL

## 2016-07-05 LAB — HEPARIN LEVEL (UNFRACTIONATED): HEPARIN UNFRACTIONATED: 0.61 [IU]/mL (ref 0.30–0.70)

## 2016-07-05 MED ORDER — MORPHINE SULFATE (PF) 2 MG/ML IV SOLN
1.0000 mg | INTRAVENOUS | Status: DC | PRN
  Administered 2016-07-05: 2 mg via INTRAVENOUS
  Administered 2016-07-05: 1 mg via INTRAVENOUS
  Filled 2016-07-05: qty 1

## 2016-07-05 MED ORDER — FUROSEMIDE 10 MG/ML IJ SOLN
40.0000 mg | Freq: Once | INTRAMUSCULAR | Status: AC
  Administered 2016-07-05: 40 mg via INTRAVENOUS
  Filled 2016-07-05: qty 4

## 2016-07-05 MED ORDER — MORPHINE SULFATE (PF) 2 MG/ML IV SOLN
INTRAVENOUS | Status: AC
  Administered 2016-07-05: 1 mg via INTRAVENOUS
  Filled 2016-07-05: qty 1

## 2016-07-05 MED ORDER — METOPROLOL TARTRATE 25 MG PO TABS
12.5000 mg | ORAL_TABLET | Freq: Two times a day (BID) | ORAL | Status: DC
  Administered 2016-07-05 – 2016-07-07 (×6): 12.5 mg via ORAL
  Filled 2016-07-05 (×7): qty 1

## 2016-07-05 MED ORDER — FUROSEMIDE 10 MG/ML IJ SOLN
20.0000 mg | Freq: Two times a day (BID) | INTRAMUSCULAR | Status: DC
  Administered 2016-07-05 – 2016-07-06 (×3): 20 mg via INTRAVENOUS
  Filled 2016-07-05 (×3): qty 2

## 2016-07-05 MED ORDER — PANTOPRAZOLE SODIUM 40 MG IV SOLR
40.0000 mg | Freq: Two times a day (BID) | INTRAVENOUS | Status: DC
  Administered 2016-07-05 – 2016-07-06 (×3): 40 mg via INTRAVENOUS
  Filled 2016-07-05 (×3): qty 40

## 2016-07-05 MED ORDER — GUAIFENESIN-DM 100-10 MG/5ML PO SYRP: 5.0000 mL | ORAL_SOLUTION | ORAL | Status: DC | PRN

## 2016-07-05 MED ORDER — FUROSEMIDE 10 MG/ML IJ SOLN: 40.0000 mg | Freq: Every day | INTRAMUSCULAR | Status: DC

## 2016-07-05 MED ORDER — BLISTEX MEDICATED EX OINT
TOPICAL_OINTMENT | CUTANEOUS | Status: DC | PRN
  Filled 2016-07-05 (×2): qty 6.3

## 2016-07-05 NOTE — Progress Notes (Signed)
Patient resting comfortably on HFNC; VSS, no signs of any distress or discomfort.  Heparin drip remains running at 800units/hr.  Daughter at bedside.  Patient to transfer to telemetry unit; report called to NekoosaAlyssa, Charity fundraiserN.

## 2016-07-05 NOTE — Significant Event (Signed)
Rapid Response Event Note  Overview: Time Called: 0700 Arrival Time: 0701 Event Type: Respiratory  Initial Focused Assessment: RR called for pt who had increased WOB after pulling off her HFNC and Bipap and drop in O2 sats.  Patient currently wearing NRB mask with O2 sats 89%, appears anxious and tachypniec.    Interventions: PRN neb treatment given at this time and will transfer back to unit for Bipap per MD orders.  Plan of Care (if not transferred):  Event Summary: Name of Physician Notified: Dr. Sheryle Hailiamond at 0700  Outcome: Transferred (Comment), Stayed in room and stabalized     Dutch QuintPatel, Aziah Kaiser D

## 2016-07-05 NOTE — Progress Notes (Signed)
   07/05/16 1231  Oxygen Therapy  O2 Device HFNC  Heater temperature 87.8 F (31 C)  O2 Flow Rate (L/min) 50 L/min  FiO2 (%) 90 % (pt has increased work of breathing and increased RR)

## 2016-07-05 NOTE — Progress Notes (Signed)
Called for pt low SPO2. Pt SPO2 81% on 80% HFNC. Placed pt back on Bipap. RN and family at bedside.

## 2016-07-05 NOTE — Progress Notes (Signed)
Patient transferred from ICU on ICU bed.  Pt's daughter at bedside, upset that patient had to be transferred at this time.  Voiced that she was told her mother was moved due to another patient needing ICU which she understands but request that patient be allowed to sleep at this time without room orientation or call bell orientation at this time.  States she will be at bedside to call if assistance needed.

## 2016-07-05 NOTE — Progress Notes (Signed)
Respiratory therapy in to place on Bipap.  Daughter out to desk to report patient removing bipap mask.  Pt anxious and refusing bipap.  Daughter reports High flow nasal cannula was removed by patient for 5-10 minutes before initial decline noted.  Rapid response called for assistance.

## 2016-07-05 NOTE — Progress Notes (Signed)
Attempted to put patient on bipap for night time. Family member asked not to apply since her vitals have been stable on HFNC 80% 50L  92% saturation.  Informed NP Tukov. She stated as long as patient is stable continue HFNC.

## 2016-07-05 NOTE — Progress Notes (Signed)
ANTICOAGULATION CONSULT NOTE   Pharmacy Consult for heparin dosing Indication: chest pain/ACS  Pharmacy consulted for heparin drip management for 83 yo female admitted with elevated troponins. Patient initiated on heparin 800 units/hr.   Goal of Therapy:  Heparin level 0.3-0.7 units/ml Monitor platelets by anticoagulation protocol: Yes   Plan:  Will continue with current rate of 800 units/hr. Will obtain confirmatory level at 2000.    5/22: HL @ 20:43 = 0.55 Will continue this pt on current rate and recheck HL on 5/23 with AM labs.   5/23 @ 0429 HL 0.61 therapeutic. Will continue current rate and recheck w/ am labs.  Allergies  Allergen Reactions  . Codeine Nausea And Vomiting  . Penicillins Hives and Other (See Comments)    Has patient had a PCN reaction causing immediate rash, facial/tongue/throat swelling, SOB or lightheadedness with hypotension: Unknown Has patient had a PCN reaction causing severe rash involving mucus membranes or skin necrosis: Unknown Has patient had a PCN reaction that required hospitalization: Unknown Has patient had a PCN reaction occurring within the last 10 years: No If all of the above answers are "NO", then may proceed with Cephalosporin use.   . Sulfa Antibiotics Other (See Comments)    Reaction: unknown    Patient Measurements: Height: 5\' 5"  (165.1 cm) Weight: 144 lb 10 oz (65.6 kg) IBW/kg (Calculated) : 57 Heparin Dosing Weight: 63.5 kg  Vital Signs: Temp: 97.9 F (36.6 C) (05/23 0437) Temp Source: Oral (05/23 0437) BP: 127/67 (05/23 0437) Pulse Rate: 107 (05/23 0437)  Labs:  Recent Labs  07/06/2016 1833  07/04/16 0157 07/04/16 0627 07/04/16 1048 07/04/16 1408 07/04/16 2043 07/05/16 0429  HGB 13.3  --   --  13.5  --   --   --  12.7  HCT 40.0  --   --  40.4  --   --   --  37.8  PLT 224  --   --  243  --   --   --  220  APTT  --   --   --  91*  --   --   --   --   LABPROT  --   --   --  15.0  --   --   --   --   INR  --   --    --  1.17  --   --   --   --   HEPARINUNFRC  --   --   --   --  0.57  --  0.55 0.61  CREATININE 1.01*  --   --  1.10*  --   --   --   --   TROPONINI  --   < > 1.85* 2.25*  --  3.17*  --   --   < > = values in this interval not displayed.  Estimated Creatinine Clearance: 30 mL/min (A) (by C-G formula based on SCr of 1.1 mg/dL (H)).   Medical History: Past Medical History:  Diagnosis Date  . Abdominal pain, right upper quadrant   . At risk for falls   . Atherosclerosis of native arteries of extremities with rest pain, unspecified extremity (HCC)   . Candidiasis of breast   . DDD (degenerative disc disease), lumbar   . Dizziness   . Elevated BP   . Head injury   . Hypertension   . Obesity   . Pain of left lower extremity   . Pelvic pain in female   .  Synovial cyst of popliteal space   . Urinary frequency   . Urinary incontinence   . Urinary tract infection   . Vaginal atrophy   . Vaginal itching   . Wound of left leg     Medications:  Scheduled:  . chlorhexidine  15 mL Mouth Rinse BID  . famotidine  20 mg Oral Daily  . furosemide  40 mg Intravenous Daily  . mouth rinse  15 mL Mouth Rinse q12n4p  . metoprolol tartrate  12.5 mg Oral BID     Pharmacy will continue to monitor and adjust per consult.   Thomasene Ripple, PharmD, BCPS Clinical Pharmacist 07/05/2016

## 2016-07-05 NOTE — Progress Notes (Signed)
   07/05/16 1800  Vitals  BP (!) 92/16  MAP (mmHg) (!) 28  Pulse Rate (!) 116  ECG Heart Rate (!) 117  Resp (!) 31  Oxygen Therapy  SpO2 91 %  O2 Device HFNC  Heater temperature 87.8 F (31 C)  O2 Flow Rate (L/min) 50 L/min  FiO2 (%) 90 %  Pulse Oximetry Type Continuous  Pain Assessment  Pain Score 0

## 2016-07-05 NOTE — Progress Notes (Signed)
Name: Tiffany Jenkins MRN: [id number] DOB: [birth year]    ADMISSION DATE:  07/09/2016 CONSULTATION DATE: 07/04/2016  REFERRING MD : Dr. Anne Hahn  CHIEF COMPLAINT: Abdominal Pain    BRIEF PATIENT DESCRIPTION:  83 yo female admitted 05/21 with NSTEMI and acute hypoxic respiratory failure secondary to multifocal pneumonia vs. pulmonary edema vs. ARDS on CT Angio Chest results.05/22  SIGNIFICANT EVENTS  05/22-Pt admitted to [facility]  05/22 transferred to med-surg but returned shortly thereafter for hypoxemic respiratory distress  STUDIES:  CT Angio Chest 05/22>>Examination is technically limited due to motion artifact. Diffuse vascular calcification. No evidence of aneurysm or dissection of the thoracic or abdominal aorta. Major vessels appear patent. Diffuse airspace consolidation throughout both lungs with small pleural effusions. This could be due to multifocal pneumonia, edema, or ARDS. No acute process demonstrated in the abdomen or pelvis. [place] Abd Limited 05/22>>No acute abnormality at the right upper quadrant. Small right pleural effusion noted. Multiple hepatic cysts again noted.  SUBJECTIVE:  Lethargic but cognition intact. Comfortable on high flow nasal cannula oxygen. Reports persistent right upper quadrant and lower anterior chest pain  VITAL SIGNS: Temp:  [97.7 F (36.5 C)-98.5 F (36.9 C)] 97.8 F (36.6 C) (05/23 0740) Pulse Rate:  [76-109] 95 (05/23 0900) Resp:  [15-31] 23 (05/23 0900) BP: (88-156)/(50-130) 101/55 (05/23 0900) SpO2:  [85 %-98 %] 91 % (05/23 1122) FiO2 (%):  [50 %-100 %] 100 % (05/23 1335) Weight:  [144 lb 10 oz (65.6 kg)-145 lb 1 oz (65.8 kg)] 145 lb 1 oz (65.8 kg) (05/23 0704)  PHYSICAL EXAMINATION:  Gen: Frail appearing, no overt respiratory distress HEENT: NCAT, sclerae white, oropharynx normal Neck: NO LAN, no JVD noted Lungs: No wheezes, bilateral crackles Cardiovascular: RRR, no murmurs Abdomen: Soft, NT, +BS Ext: no  C/C/E Neuro: No focal deficits Skin: No lesions noted    Recent Labs Lab 06/26/2016 1833 07/04/16 0627 07/05/16 0429  NA 134* 134* 135  K 4.5 4.3 3.9  CL 105 103 102  CO2 20* 21* 24  BUN 15 19 27*  CREATININE 1.01* 1.10* 1.20*  GLUCOSE 113* 144* 89    Recent Labs Lab 06/29/2016 1833 07/04/16 0627 07/05/16 0429  HGB 13.3 13.5 12.7  HCT 40.0 40.4 37.8  WBC 11.0 17.3* 11.5*  PLT 224 243 220   CXR: Improved bilateral airspace disease  ASSESSMENT / PLAN: Acute hypoxemic respiratory failure - severity of hypoxemia seems out of proportion to CXR findings Suspect pulmonary edema - improved by chest x-ray Doubt PNA NSTEMI - suspect stuttering ischemia LBBB AKI, nonoliguric RUQ and chest pain - suspect anginal equivalent   PLAN/REC: Continue SDU level of care Supplemental O2 to maintain SpO2 > 90% Cont diuresis as permitted by BP and renal function Recheck troponin I in a.m. 05/24 Echocardiogram ordered - family very strongly wants to know the results of this test Cardiology following Monitor BMET intermittently Monitor I/Os Correct electrolytes as indicated  Family conference: I spoke with the patient's husband, offspring and other family members and friends in detail. We discussed goals of care and her current medical status. I conveyed a guarded prognosis. We discussed limitations of care and all agree that we will not undertake intubation, mechanical ventilation, ACLS/CPR under any circumstances. These decisions are in keeping with the patient's clearly expressed wishes. However, the family is hopeful that the patient can recover sufficiently to go home for some quality time with her loved ones.  PLEASE DO NOT TRANSFER PATIENT OUT OF ICU/SDU IN MIDDLE OF  NIGHT   Billy Fischeravid Simonds, MD PCCM service Mobile [phone] Pager [phone] 07/05/2016 2:04 PM

## 2016-07-05 NOTE — Progress Notes (Signed)
Transferred To ICU 4 via bed with Bipap intact.  Respiratory therapy at bedside.

## 2016-07-05 NOTE — Progress Notes (Signed)
Advanced Care Plan.  Purpose of Encounter: : Code Status. Parties in Attendance: The patient and me. Patient's Decisional Capacity: Yes. Medical Story: Negative for years old Caucasian female was admitted for acute respiratory failure due to pulmonary edema secondary to acute CHF. She was suspected to pneumonia and treated with antibiotics. She is still on high flow oxygen. I asked the patient about her wish for CODE STATUS. She said she wants DO NOT RESUSCITATE.  Plan:  Code Status: DO NOT RESUSCITATE. Time spent discussing advance care planning: 17 minutes.

## 2016-07-05 NOTE — Progress Notes (Signed)
   07/05/16 1422  Urine Characteristics  Bladder Scan Volume (mL) 0 mL

## 2016-07-05 NOTE — Progress Notes (Signed)
Chaplain received an order to visit with pt in room IC4. Pt was end of life and needed some encouragement. Pt was tired and requested a short visit. Chaplain provided emotional support.    07/05/16 0930  Clinical Encounter Type  Visited With Patient;Patient and family together  Visit Type Initial;Spiritual support  Referral From Nurse  Consult/Referral To Chaplain  Spiritual Encounters  Spiritual Needs Emotional

## 2016-07-05 NOTE — Progress Notes (Signed)
*  PRELIMINARY RESULTS* Echocardiogram 2D Echocardiogram has been performed.  Cristela BlueHege, Thaddaeus Granja 07/05/2016, 2:12 PM

## 2016-07-05 NOTE — Progress Notes (Signed)
[facility] - [place] [facility]   PATIENT NAME: Tiffany Jenkins    MR#:  [id number]  DATE OF BIRTH:  May 12, 1923  SUBJECTIVE:  CHIEF COMPLAINT:   Chief Complaint  Patient presents with  . Abdominal Pain   Better cough and shortness of breath. on high flow oxygen. She complains of chest pain and epigastric pain. REVIEW OF SYSTEMS:  Review of Systems  Constitutional: Positive for malaise/fatigue. Negative for chills and fever.  HENT: Positive for hearing loss. Negative for congestion and sinus pain.   Eyes: Negative for blurred vision and double vision.  Respiratory: Positive for cough and shortness of breath. Negative for hemoptysis, wheezing and stridor.   Cardiovascular: Positive for chest pain. Negative for palpitations and leg swelling.  Gastrointestinal: Positive for abdominal pain. Negative for blood in stool, diarrhea, melena, nausea and vomiting.  Genitourinary: Negative for dysuria and hematuria.  Musculoskeletal: Negative for back pain.  Skin: Negative for itching and rash.  Neurological: Positive for weakness. Negative for dizziness, focal weakness, loss of consciousness and headaches.  Psychiatric/Behavioral: Negative for depression. The patient is not nervous/anxious.     DRUG ALLERGIES:   Allergies  Allergen Reactions  . Codeine Nausea And Vomiting  . Penicillins Hives and Other (See Comments)    Has patient had a PCN reaction causing immediate rash, facial/tongue/throat swelling, SOB or lightheadedness with hypotension: Unknown Has patient had a PCN reaction causing severe rash involving mucus membranes or skin necrosis: Unknown Has patient had a PCN reaction that required hospitalization: Unknown Has patient had a PCN reaction occurring within the last 10 years: No If all of the above answers are "NO", then may proceed with Cephalosporin use.   . Sulfa Antibiotics Other (See Comments)    Reaction: unknown   VITALS:  Blood pressure (!)  101/55, pulse 95, temperature 97.8 F (36.6 C), temperature source Axillary, resp. rate (!) 23, height 5\' 5"  (1.651 m), weight 145 lb 1 oz (65.8 kg), SpO2 91 %. PHYSICAL EXAMINATION:  Physical Exam  Constitutional: She is oriented to person, place, and time.  Critical and fragile looking  HENT:  Head: Normocephalic.  Mouth/Throat: Oropharynx is clear and moist.  Eyes: Conjunctivae and EOM are normal. No scleral icterus.  Neck: Normal range of motion. Neck supple. No JVD present. No tracheal deviation present.  Cardiovascular: Normal rate, regular rhythm and normal heart sounds.  Exam reveals no gallop.   No murmur heard. Pulmonary/Chest: Effort normal. No respiratory distress. She has no wheezes. She has rales.  Abdominal: Soft. Bowel sounds are normal. She exhibits no distension. There is no tenderness.  Musculoskeletal: Normal range of motion. She exhibits no tenderness.  Neurological: She is alert and oriented to person, place, and time. No cranial nerve deficit.  Skin: No rash noted. No erythema.  Psychiatric: Affect normal.   LABORATORY PANEL:  Female CBC  Recent Labs Lab 07/05/16 0429  WBC 11.5*  HGB 12.7  HCT 37.8  PLT 220   ------------------------------------------------------------------------------------------------------------------ Chemistries   Recent Labs Lab 07/06/2016 1833  07/05/16 0429  NA 134*  < > 135  K 4.5  < > 3.9  CL 105  < > 102  CO2 20*  < > 24  GLUCOSE 113*  < > 89  BUN 15  < > 27*  CREATININE 1.01*  < > 1.20*  CALCIUM 9.0  < > 8.3*  AST 41  --   --   ALT 19  --   --   ALKPHOS 58  --   --  BILITOT 1.3*  --   --   < > = values in this interval not displayed. RADIOLOGY:  Dg Chest Port 1 View  Result Date: 07/05/2016 CLINICAL DATA:  Respiratory failure. EXAM: PORTABLE CHEST 1 VIEW COMPARISON:  07/04/2016.  2016/11/07.  CT 07/04/2016. FINDINGS: Mediastinum stable. Heart size stable. Persistent bilateral perihilar, bilateral upper lobe, left  lower lobe infiltrates/edema. Slight improvement from prior exam. No pleural effusion or pneumothorax. Heart size stable. IMPRESSION: Persistent bilateral perihilar, bilateral upper lobe, left lower lobe infiltrate/edema. Slight improvement from prior exam. Electronically Signed   By: Maisie Fushomas  Register   On: 07/05/2016 06:23   ASSESSMENT AND PLAN:   Acute hypoxemic respiratory failure due to acute CHF. The patient is off BiPAP, but on high flow oxygen. Continue nebulizer and antibiotics was discontinue. Follow-up CBC and cultures. f/u Dr. Sung AmabileSimonds.  Acute CHF. Unclear diastolic or systolic, Continue Lasix IV twice a day, follow-up echocardiogram and cardiology consult.  NSTEMI, continue heparin drip and follow-up cardiology consult. Follow-up echocardiogram.  HTN (hypertension) - stable, continue home meds  All the records are reviewed and case discussed with Care Management/Social Worker. Management plans discussed with the patient, her Husband, son, daughter and granddaughter and they are in agreement.  CODE STATUS: DNR  TOTAL TIME TAKING CARE OF THIS PATIENT: 35 minutes.   More than 50% of the time was spent in counseling/coordination of care: YES  POSSIBLE D/C IN 3-4 DAYS, DEPENDING ON CLINICAL CONDITION.   Shaune Pollackhen, Aryaa Bunting M.D on 07/05/2016 at 1:13 PM  Between 7am to 6pm - Pager - [phone]  After 6pm go to [email or web address] - Social research officer, governmentpassword [email or web address]  [facility] [place] [facility]  Office  [phone]  CC: Primary care physician; Corky DownsMasoud, Javed, MD  Note: This dictation was prepared with Dragon dictation along with smaller phrase technology. Any transcriptional errors that result from this process are unintentional.

## 2016-07-05 NOTE — Progress Notes (Signed)
Nonrebreather mask applied.  Pt tolerating and wearing while staff at bedside.  Oxygen saturations returned to 89% on NRB.  Lung sounds with inspiratory and expiratory wheezes noted on auscultation per Administrative Coordinator Theadore NanAlicia Rawlins.  Prn nebulizer given

## 2016-07-05 NOTE — Progress Notes (Signed)
Pt's daughter rang out to report "she thinks she's dying".  Pt reports shortness of breath on assessment.  Lungs diminished.  Oxygen saturations 83% on high flow nasal cannula.  Respiratory Therapy paged to come assess.  MD notified of change in condition.

## 2016-07-05 NOTE — Progress Notes (Signed)
   07/05/16 1335  Oxygen Therapy  O2 Device HFNC  Heater temperature 87.8 F (31 C)  O2 Flow Rate (L/min) 50 L/min  FiO2 (%) 100 %

## 2016-07-06 ENCOUNTER — Inpatient Hospital Stay: Payer: Medicare Other

## 2016-07-06 DIAGNOSIS — I255 Ischemic cardiomyopathy: Secondary | ICD-10-CM

## 2016-07-06 DIAGNOSIS — Z515 Encounter for palliative care: Secondary | ICD-10-CM

## 2016-07-06 DIAGNOSIS — J9600 Acute respiratory failure, unspecified whether with hypoxia or hypercapnia: Secondary | ICD-10-CM

## 2016-07-06 DIAGNOSIS — Z7189 Other specified counseling: Secondary | ICD-10-CM

## 2016-07-06 DIAGNOSIS — J810 Acute pulmonary edema: Secondary | ICD-10-CM

## 2016-07-06 LAB — TROPONIN I: TROPONIN I: 2.66 ng/mL — AB (ref <0.03)

## 2016-07-06 LAB — BASIC METABOLIC PANEL
Anion gap: 9 (ref 5–15)
BUN: 35 mg/dL — ABNORMAL HIGH (ref 6–20)
CALCIUM: 8.4 mg/dL — AB (ref 8.9–10.3)
CO2: 28 mmol/L (ref 22–32)
CREATININE: 1.22 mg/dL — AB (ref 0.44–1.00)
Chloride: 100 mmol/L — ABNORMAL LOW (ref 101–111)
GFR, EST AFRICAN AMERICAN: 44 mL/min — AB (ref >60)
GFR, EST NON AFRICAN AMERICAN: 38 mL/min — AB (ref >60)
Glucose, Bld: 110 mg/dL — ABNORMAL HIGH (ref 65–99)
Potassium: 4.1 mmol/L (ref 3.5–5.1)
Sodium: 137 mmol/L (ref 135–145)

## 2016-07-06 LAB — CBC
HCT: 35.3 % (ref 35.0–47.0)
Hemoglobin: 11.9 g/dL — ABNORMAL LOW (ref 12.0–16.0)
MCH: 30.5 pg (ref 26.0–34.0)
MCHC: 33.7 g/dL (ref 32.0–36.0)
MCV: 90.5 fL (ref 80.0–100.0)
PLATELETS: 231 10*3/uL (ref 150–440)
RBC: 3.9 MIL/uL (ref 3.80–5.20)
RDW: 13.1 % (ref 11.5–14.5)
WBC: 11.1 10*3/uL — AB (ref 3.6–11.0)

## 2016-07-06 LAB — HEPARIN LEVEL (UNFRACTIONATED): HEPARIN UNFRACTIONATED: 0.35 [IU]/mL (ref 0.30–0.70)

## 2016-07-06 LAB — ECHOCARDIOGRAM COMPLETE
HEIGHTINCHES: 65 in
WEIGHTICAEL: 2321 [oz_av]

## 2016-07-06 MED ORDER — SENNOSIDES-DOCUSATE SODIUM 8.6-50 MG PO TABS
1.0000 | ORAL_TABLET | Freq: Two times a day (BID) | ORAL | Status: DC
  Administered 2016-07-06 – 2016-07-08 (×5): 1 via ORAL
  Filled 2016-07-06 (×5): qty 1

## 2016-07-06 MED ORDER — HEPARIN SODIUM (PORCINE) 5000 UNIT/ML IJ SOLN
5000.0000 [IU] | Freq: Two times a day (BID) | INTRAMUSCULAR | Status: DC
  Administered 2016-07-07 – 2016-07-08 (×3): 5000 [IU] via SUBCUTANEOUS
  Filled 2016-07-06 (×5): qty 1

## 2016-07-06 MED ORDER — NITROGLYCERIN 0.2 MG/HR TD PT24
0.2000 mg | MEDICATED_PATCH | Freq: Every day | TRANSDERMAL | Status: DC
  Administered 2016-07-06 – 2016-07-07 (×2): 0.2 mg via TRANSDERMAL
  Filled 2016-07-06 (×4): qty 1

## 2016-07-06 MED ORDER — PANTOPRAZOLE SODIUM 40 MG IV SOLR: 40.0000 mg | INTRAVENOUS | Status: DC

## 2016-07-06 MED ORDER — LORAZEPAM 0.5 MG PO TABS: 0.5000 mg | ORAL_TABLET | Freq: Four times a day (QID) | ORAL | Status: DC | PRN

## 2016-07-06 MED ORDER — HYDRALAZINE HCL 10 MG PO TABS
10.0000 mg | ORAL_TABLET | Freq: Three times a day (TID) | ORAL | Status: DC
  Administered 2016-07-06: 10 mg via ORAL
  Filled 2016-07-06 (×7): qty 1

## 2016-07-06 MED ORDER — FUROSEMIDE 40 MG PO TABS
40.0000 mg | ORAL_TABLET | Freq: Every day | ORAL | Status: DC
  Administered 2016-07-06 – 2016-07-07 (×2): 40 mg via ORAL
  Filled 2016-07-06 (×3): qty 1

## 2016-07-06 MED ORDER — FENTANYL 12 MCG/HR TD PT72
12.5000 ug | MEDICATED_PATCH | TRANSDERMAL | Status: DC
  Administered 2016-07-06: 12.5 ug via TRANSDERMAL
  Filled 2016-07-06: qty 1

## 2016-07-06 MED ORDER — MORPHINE SULFATE (PF) 2 MG/ML IV SOLN
1.0000 mg | INTRAVENOUS | Status: DC | PRN
  Administered 2016-07-07: 2 mg via INTRAVENOUS
  Filled 2016-07-06 (×2): qty 1

## 2016-07-06 NOTE — Progress Notes (Signed)
   Name: Tiffany Jenkins MRN: [id number] DOB: 11/30/1923    ADMISSION DATE:  06/27/2016 CONSULTATION DATE: 07/04/2016  REFERRING MD : Dr. Anne HahnWillis  CHIEF COMPLAINT: Abdominal Pain    BRIEF PATIENT DESCRIPTION:  83 yo female admitted 05/21 with NSTEMI and acute hypoxic respiratory failure secondary to multifocal pneumonia vs. pulmonary edema vs. ARDS on CT Angio Chest results.05/22  SIGNIFICANT EVENTS  05/22-Pt admitted to [facility]  05/22 transferred to med-surg but returned shortly thereafter for hypoxemic respiratory distress  STUDIES:  CT Angio Chest 05/22>>Examination is technically limited due to motion artifact. Diffuse vascular calcification. No evidence of aneurysm or dissection of the thoracic or abdominal aorta. Major vessels appear patent. Diffuse airspace consolidation throughout both lungs with small pleural effusions. This could be due to multifocal pneumonia, edema, or ARDS. No acute process demonstrated in the abdomen or pelvis. US Abd Limited 05/22>>No acute abnormality at the right upper quadrant. Small right pleural effusion noted. Multiple hepatic cysts again noted. Echocardiogram 05/22: (informal read): Severe LV dysfunction with segmental wall motion abnormalities, dilated LA and moderate pulmonary hypertension  SUBJECTIVE:  RASS 0. Cognition intact. No overt distress. Reports persistent intermittent RUQ pain  VITAL SIGNS: Temp:  [97.5 F (36.4 C)-97.9 F (36.6 C)] 97.9 F (36.6 C) (05/24 0800) Pulse Rate:  [89-120] 112 (05/24 1100) Resp:  [16-33] 19 (05/24 1100) BP: (90-133)/(16-95) 121/89 (05/24 1100) SpO2:  [86 %-98 %] 89 % (05/24 1100) FiO2 (%):  [60 %-100 %] 60 % (05/24 0832)  PHYSICAL EXAMINATION:  Gen: Frail appearing, no overt respiratory distress HEENT: NCAT, sclerae white, oropharynx normal Neck: NO LAN, no JVD noted Lungs: No wheezes, bilateral crackles Cardiovascular: RRR, no murmurs Abdomen: Soft, NT, +BS Ext: no C/C/E Neuro:  No focal deficits Skin: No lesions noted    Recent Labs Lab 07/04/16 0627 07/05/16 0429 07/06/16 0503  NA 134* 135 137  K 4.3 3.9 4.1  CL 103 102 100*  CO2 21* 24 28  BUN 19 27* 35*  CREATININE 1.10* 1.20* 1.22*  GLUCOSE 144* 89 110*    Recent Labs Lab 07/04/16 0627 07/05/16 0429 07/06/16 0503  HGB 13.5 12.7 11.9*  HCT 40.4 37.8 35.3  WBC 17.3* 11.5* 11.1*  PLT 243 220 231   CXR: Interim partial clearing of bilateral perihilar and upper lobe infiltrates. Persistent left base atelectasis/ infiltrate  ASSESSMENT / PLAN: Acute hypoxemic respiratory failure - O2 requirements improving Pulmonary edema NSTEMI with stuttering pattern of troponin I elevation suggesting ongoing ischemia Ischemic cardiomyopathy RUQ pain - suspect anginal equivalent LBBB AKI, nonoliguric  PLAN/REC: Continue SDU level of care until O2 requirements < 50%  Discussion of echocardiogram findings with family and patient  DNR re-confirmed  Goal established of trying to get her home with Hospice or into residential Hospice Transition to oral regimen  DC IV heparin  Low dose hydralazine for afterload reduction  Topical NTG patch Palliative Care consultation requested Low dose Duragesic patch for ongoing pain  30 mins of this encounter were spent discussing goals of care   Billy Fischeravid Niccolo Burggraf, MD PCCM service Mobile [phone] Pager [phone] 07/06/2016 1:41 PM

## 2016-07-06 NOTE — Progress Notes (Signed)
[facility] - [place] [facility]   PATIENT NAME: Tiffany Jenkins    MR#:  [id number]  DATE OF BIRTH:  08-May-1923  SUBJECTIVE:  CHIEF COMPLAINT:   Chief Complaint  Patient presents with  . Abdominal Pain   Still cough and shortness of breath. Still on high flow oxygen.  REVIEW OF SYSTEMS:  Review of Systems  Constitutional: Positive for malaise/fatigue. Negative for chills and fever.  HENT: Positive for hearing loss. Negative for congestion and sinus pain.   Eyes: Negative for blurred vision and double vision.  Respiratory: Positive for cough and shortness of breath. Negative for hemoptysis, wheezing and stridor.   Cardiovascular: Positive for chest pain. Negative for palpitations and leg swelling.  Gastrointestinal: Negative for abdominal pain, blood in stool, diarrhea, melena, nausea and vomiting.  Genitourinary: Negative for dysuria and hematuria.  Musculoskeletal: Negative for back pain.  Skin: Negative for itching and rash.  Neurological: Positive for weakness. Negative for dizziness, focal weakness, loss of consciousness and headaches.  Psychiatric/Behavioral: Negative for depression. The patient is not nervous/anxious.     DRUG ALLERGIES:   Allergies  Allergen Reactions  . Codeine Nausea And Vomiting  . Penicillins Hives and Other (See Comments)    Has patient had a PCN reaction causing immediate rash, facial/tongue/throat swelling, SOB or lightheadedness with hypotension: Unknown Has patient had a PCN reaction causing severe rash involving mucus membranes or skin necrosis: Unknown Has patient had a PCN reaction that required hospitalization: Unknown Has patient had a PCN reaction occurring within the last 10 years: No If all of the above answers are "NO", then may proceed with Cephalosporin use.   . Sulfa Antibiotics Other (See Comments)    Reaction: unknown   VITALS:  Blood pressure 121/89, pulse (!) 112, temperature 97.9 F (36.6 C),  temperature source Oral, resp. rate 19, height 5\' 5"  (1.651 m), weight 145 lb 1 oz (65.8 kg), SpO2 (!) 89 %. PHYSICAL EXAMINATION:  Physical Exam  Constitutional: She is oriented to person, place, and time.  Critical and fragile looking  HENT:  Head: Normocephalic.  Mouth/Throat: Oropharynx is clear and moist.  Eyes: Conjunctivae and EOM are normal. No scleral icterus.  Neck: Normal range of motion. Neck supple. No JVD present. No tracheal deviation present.  Cardiovascular: Normal rate, regular rhythm and normal heart sounds.  Exam reveals no gallop.   No murmur heard. Pulmonary/Chest: Effort normal. No respiratory distress. She has no wheezes. She has rales.  Abdominal: Soft. Bowel sounds are normal. She exhibits no distension. There is no tenderness.  Musculoskeletal: Normal range of motion. She exhibits no tenderness.  Neurological: She is alert and oriented to person, place, and time. No cranial nerve deficit.  Skin: No rash noted. No erythema.  Psychiatric: Affect normal.   LABORATORY PANEL:  Female CBC  Recent Labs Lab 07/06/16 0503  WBC 11.1*  HGB 11.9*  HCT 35.3  PLT 231   ------------------------------------------------------------------------------------------------------------------ Chemistries   Recent Labs Lab 07-04-16 1833  07/06/16 0503  NA 134*  < > 137  K 4.5  < > 4.1  CL 105  < > 100*  CO2 20*  < > 28  GLUCOSE 113*  < > 110*  BUN 15  < > 35*  CREATININE 1.01*  < > 1.22*  CALCIUM 9.0  < > 8.4*  AST 41  --   --   ALT 19  --   --   ALKPHOS 58  --   --   BILITOT  1.3*  --   --   < > = values in this interval not displayed. RADIOLOGY:  Dg Chest Port 1 View  Result Date: 07/06/2016 CLINICAL DATA:  Respiratory failure. EXAM: PORTABLE CHEST 1 VIEW COMPARISON:  07/05/2016.  CT 07/04/2016. FINDINGS: Heart size stable. Interim partial clearing of bilateral perihilar and upper lobe infiltrates. New right base atelectasis/ infiltrate. Stable left base  atelectasis/ infiltrate. New small right pleural effusion. No pneumothorax. Stable cardiomegaly. IMPRESSION: 1. Interim partial clearing of bilateral perihilar and upper lobe infiltrates. Persistent left base atelectasis/ infiltrate. 2. New mild right base atelectasis/ infiltrate and small right pleural effusion. Findings new from prior exam. Electronically Signed   By: Maisie Fushomas  Register   On: 07/06/2016 06:36   ASSESSMENT AND PLAN:   Acute hypoxemic respiratory failure due to acute CHF. The patient is off BiPAP, but on high flow oxygen. Continue nebulizer and antibiotics was discontinue. Follow-up CBC and cultures. f/u Dr. Sung AmabileSimonds.  Acute CHF. Unclear diastolic or systolic, Continue Lasix IV twice a day, follow-up echocardiogram and cardiology consult.  NSTEMI, heparin drip is discontinued. Follow-up echocardiogram report.  HTN (hypertension) - stable, continue home meds  Very poor prognosis. Dr. Sung AmabileSimonds discussed with the patient's family about hospice care. Waiting for Palliative Care consult. All the records are reviewed and case discussed with Care Management/Social Worker. Management plans discussed with the patient, her son and grandson and they are in agreement.  CODE STATUS: DNR  TOTAL TIME TAKING CARE OF THIS PATIENT: 37 minutes.   More than 50% of the time was spent in counseling/coordination of care: YES  POSSIBLE D/C IN 3 DAYS, DEPENDING ON CLINICAL CONDITION.   Shaune Pollackhen, Autumne Kallio M.D on 07/06/2016 at 12:26 PM  Between 7am to 6pm - Pager - [phone]  After 6pm go to [email or web address] - Social research officer, governmentpassword [email or web address]  [facility] [place] [facility]  Office  [phone]  CC: Primary care physician; Corky DownsMasoud, Javed, MD  Note: This dictation was prepared with Dragon dictation along with smaller phrase technology. Any transcriptional errors that result from this process are unintentional.

## 2016-07-06 NOTE — Consult Note (Signed)
Consultation Note Date: 07/06/2016   Patient Name: Tiffany Jenkins  DOB: 1923-05-18  MRN: [id number]  Age / Sex: 83 y.o., female  PCP: Cletis Athens, MD Referring Physician: Demetrios Loll, MD  Reason for Consultation: Establishing goals of care  HPI/Patient Profile: 83 y.o. female  with past medical history of urinary incontinence, dysphagia secondary to esophageal strictures, hypertension, degenerative disc disease, candidiasis of breast, dizziness, and atherosclerosis admitted on 07/02/2016 with right upper quadrant abdominal pain with radiation to back. In ED, troponin of 2.2, NSTEMI, and with acute hypoxic respiratory distress requiring BiPAP. CT angio reveals multifocal pneumonia, pulmonary edema, or ARDS. US abdomen negative for acute abnormality. ECHO on 5/22 with EF of 20-25%, severe LV dysfunction, dilated LA and moderate pulmonary hypertension. DNR. Now on high flow nasal cannula. Not a candidate for invasive cardiology workup. Palliative medicine consultation for goals of care/hospice evaluation.     Clinical Assessment and Goals of Care: I have reviewed medical records, discussed with care team, and met with patient and husband at bedside to discuss diagnosis, prognosis, GOC, EOL wishes, disposition and options. Also met with multiple family members (including two daughters and son) in family waiting room.   Introduced Palliative Medicine as specialized medical care for people living with serious illness. It focuses on providing relief from the symptoms and stress of a serious illness.  We discussed a brief life review of the patient. Prior to hospitalization, patient independent, living at home with husband, and still cooking/cleaning. She has been married for 63 years with four children and multiple grand and great grandchildren. Children share many stories of their mother and father. They describe  her as "hardworking, strong, and humble."    Discussed diagnoses, interventions, and guarded prognosis. Husband and children understand poor prognosis and wish for their mother to be comfortable and not suffer. She is still awake, alert, oriented and able to make decisions regarding her medical care--including her wish for DNR/DNI.   Daughters tell me their mother understands she is dying and is at peace. She has no fears and is a very spiritual individual. [name] has been calling in family members one at a time and speaking of her wishes regarding EOL and funeral arrangements.   The difference between aggressive medical intervention and comfort care was considered in light of the patient's goals of care. Discussed transition to comfort measures only with focus being on comfort, quality, and dignity for the time she has left. Family understands her prognosis is likely hours to days once she is transitioned off of high flow nasal. Educated on focus of symptom management to relieve suffering. Family agrees and speak of her relief from pain and shortness of breath with morphine.   We discussed hospice options--residential facility versus home with hospice. The patient "does not want to hear the word hospice." Daughters tell me she understands she is dying but does not want to go to a hospice home. We discussed hospice at home being an option. They are worried this will be hard for  their father to watch their mother decline and die at home. The family prefers she be transferred out of the ICU and given "hospice in the hospital." We discussed transitioning off of HFNC to nasal cannula and discontinuation of medications/interventions/cardiac monitoring that is not aimed at comfort. Then, transfer to 1C for comfort measures only. Family understands she may die quickly but if she does not, they will need to further pursue hospice options (whether home or hospice facility). Family requests she stay in the ICU  today to allow time for pain medications to work (fentantyl/nitro patch) and for family to visit. Then transition to comfort measures tomorrow.       Questions and concerns were addressed.  Hard Choices and Gone from my Sight books given. Provided emotional and spiritual support.     SUMMARY OF RECOMMENDATIONS    DNR/DNI  Continue current interventions today. Daughters request time for family to visit.   Likely transition to comfort measures only tomorrow including transition to nasal cannula from high flow and transfer to 1C for EOL care.   Family prefers she die in the hospital--which may be hours to days when high flow is discontinued. If she survives longer, family understands they will need to make a decision about hospice (home or inpatient).   PMT will f/u in AM.   Code Status/Advance Care Planning:  DNR  Symptom Management:   Morphine 1-68m IV q2h prn pain/dypsnea/air hunger  Ativan 0.553mPO q6h prn anxiety  Fentanyl and nitro patch per attending  Palliative Prophylaxis:   Aspiration, Frequent Pain Assessment, Oral Care and Turn Reposition  Psycho-social/Spiritual:   Desire for further Chaplaincy support:yes  Additional Recommendations: Caregiving  Support/Resources, Compassionate Wean Education and Education on Hospice  Prognosis:   < 2 weeks: likely hours to days when transitioned to nasal cannula from HFNC.   Discharge Planning: To Be Determined hospital death vs. Hospice home     Primary Diagnoses: Present on Admission: . NSTEMI (non-ST elevated myocardial infarction) ([facility]. HTN (hypertension) . Overactive bladder   I have reviewed the medical record, interviewed the patient and family, and examined the patient. The following aspects are pertinent.  Past Medical History:  Diagnosis Date  . Abdominal pain, right upper quadrant   . At risk for falls   . Atherosclerosis of native arteries of extremities with rest pain, unspecified extremity ([facility]   . Candidiasis of breast   . DDD (degenerative disc disease), lumbar   . Dizziness   . Elevated BP   . Head injury   . Hypertension   . Obesity   . Pain of left lower extremity   . Pelvic pain in female   . Synovial cyst of popliteal space   . Urinary frequency   . Urinary incontinence   . Urinary tract infection   . Vaginal atrophy   . Vaginal itching   . Wound of left leg    Social History   Social History  . Marital status: Married    Spouse name: N/A  . Number of children: N/A  . Years of education: N/A   Social History Main Topics  . Smoking status: Never Smoker  . Smokeless tobacco: None  . Alcohol use No  . Drug use: No  . Sexual activity: Not Asked   Other Topics Concern  . None   Social History Narrative  . None   Family History  Problem Relation Age of Onset  . Prostate cancer Son   . Kidney disease  Mother   . Breast cancer Sister    Scheduled Meds: . chlorhexidine  15 mL Mouth Rinse BID  . fentaNYL  12.5 mcg Transdermal Q72H  . furosemide  40 mg Oral Daily  . heparin subcutaneous  5,000 Units Subcutaneous Q12H  . hydrALAZINE  10 mg Oral Q8H  . mouth rinse  15 mL Mouth Rinse q12n4p  . metoprolol tartrate  12.5 mg Oral BID  . nitroGLYCERIN  0.2 mg Transdermal Daily  . senna-docusate  1 tablet Oral BID   Continuous Infusions: PRN Meds:.acetaminophen **OR** acetaminophen, guaiFENesin-dextromethorphan, ipratropium-albuterol, lip balm, LORazepam, morphine injection, [DISCONTINUED] ondansetron **OR** ondansetron (ZOFRAN) IV Medications Prior to Admission:  Prior to Admission medications   Medication Sig Start Date End Date Taking? Authorizing Provider  amLODipine (NORVASC) 5 MG tablet Reported on 07/07/2015 05/21/15  Yes [provider]   Allergies  Allergen Reactions  . Codeine Nausea And Vomiting  . Penicillins Hives and Other (See Comments)    Has patient had a PCN reaction causing immediate rash, facial/tongue/throat swelling, SOB or  lightheadedness with hypotension: Unknown Has patient had a PCN reaction causing severe rash involving mucus membranes or skin necrosis: Unknown Has patient had a PCN reaction that required hospitalization: Unknown Has patient had a PCN reaction occurring within the last 10 years: No If all of the above answers are "NO", then may proceed with Cephalosporin use.   . Sulfa Antibiotics Other (See Comments)    Reaction: unknown   Review of Systems  Unable to perform ROS: Acuity of condition   Physical Exam  Constitutional: She is oriented to person, place, and time. She is easily aroused. She appears ill.  HENT:  Head: Normocephalic and atraumatic.  Cardiovascular: Tachycardia present.   Pulmonary/Chest: No accessory muscle usage. No tachypnea. No respiratory distress.  Intermittent dyspnea at rest/crackles  Abdominal: Normal appearance.  Neurological: She is alert, oriented to person, place, and time and easily aroused.  Skin: Skin is warm and dry. There is pallor.  Psychiatric: She is withdrawn. She is inattentive.  Nursing note and vitals reviewed.  Vital Signs: BP 121/89   Pulse (!) 112   Temp 97.9 F (36.6 C) (Oral)   Resp 19   Ht 5' 5"  (1.651 m)   Wt 65.8 kg (145 lb 1 oz)   SpO2 (!) 89%   BMI 24.14 kg/m  Pain Assessment: No/denies pain   Pain Score: 0-No pain  SpO2: SpO2: (!) 89 % O2 Device:SpO2: (!) 89 % O2 Flow Rate: .O2 Flow Rate (L/min): 50 L/min  IO: Intake/output summary:   Intake/Output Summary (Last 24 hours) at 07/06/16 1321 Last data filed at 07/06/16 1100  Gross per 24 hour  Intake              328 ml  Output                0 ml  Net              328 ml    LBM: Last BM Date: 06/30/2016 Baseline Weight: Weight: 63.5 kg (140 lb) Most recent weight: Weight: 65.8 kg (145 lb 1 oz)     Palliative Assessment/Data: PPS 30%   Flowsheet Rows     Most Recent Value  Intake Tab  Date Notified  07/06/16  Date of Admission  06/15/2016  Date first seen by  Palliative Care  07/06/16  # of days Palliative referral response time  0 Day(s)  # of days IP prior to Palliative referral  3  Clinical Assessment  Palliative Performance Scale Score  30%  Psychosocial & Spiritual Assessment  Palliative Care Outcomes  Patient/Family meeting held?  Yes  Who was at the meeting?  patient, husband, and multiple family members  Palliative Care Outcomes  Clarified goals of care, Counseled regarding hospice, Provided psychosocial or spiritual support, Provided end of life care assistance, Improved pain interventions      Time In: 1200 Time Out: 1315 Time Total: 78mn Greater than 50%  of this time was spent counseling and coordinating care related to the above assessment and plan.  Signed by:  MIhor Dow FNP-C Palliative Medicine Team  Phone: [phone]: [phone] Please contact Palliative Medicine Team phone at [phone] questions and concerns.  For individual provider: See AShea Evans

## 2016-07-06 NOTE — Progress Notes (Signed)
ANTICOAGULATION CONSULT NOTE   Pharmacy Consult for heparin dosing Indication: chest pain/ACS  Pharmacy consulted for heparin drip management for 83 yo female admitted with elevated troponins. Patient initiated on heparin 800 units/hr.   Goal of Therapy:  Heparin level 0.3-0.7 units/ml Monitor platelets by anticoagulation protocol: Yes   Plan:  Will continue with current rate of 800 units/hr. Will obtain confirmatory level at 2000.    5/22: HL @ 20:43 = 0.55 Will continue this pt on current rate and recheck HL on 5/23 with AM labs.   5/23 @ 0429 HL 0.61 therapeutic. Will continue current rate and recheck w/ am labs.  5/24 @ 0500 HL 0.35 therapeutic. Will continue current rate and recheck w/ am labs.  Allergies  Allergen Reactions  . Codeine Nausea And Vomiting  . Penicillins Hives and Other (See Comments)    Has patient had a PCN reaction causing immediate rash, facial/tongue/throat swelling, SOB or lightheadedness with hypotension: Unknown Has patient had a PCN reaction causing severe rash involving mucus membranes or skin necrosis: Unknown Has patient had a PCN reaction that required hospitalization: Unknown Has patient had a PCN reaction occurring within the last 10 years: No If all of the above answers are "NO", then may proceed with Cephalosporin use.   . Sulfa Antibiotics Other (See Comments)    Reaction: unknown    Patient Measurements: Height: 5\' 5"  (165.1 cm) Weight: 145 lb 1 oz (65.8 kg) IBW/kg (Calculated) : 57 Heparin Dosing Weight: 63.5 kg  Vital Signs: Temp: 97.5 F (36.4 C) (05/24 0200) Temp Source: Oral (05/24 0200) BP: 109/65 (05/24 0316) Pulse Rate: 99 (05/24 0316)  Labs:  Recent Labs  06/16/2016 1833  07/04/16 0627  07/04/16 1408 07/04/16 2043 07/05/16 0429 07/06/16 0503  HGB 13.3  --  13.5  --   --   --  12.7 11.9*  HCT 40.0  --  40.4  --   --   --  37.8 35.3  PLT 224  --  243  --   --   --  220 231  APTT  --   --  91*  --   --   --   --    --   LABPROT  --   --  15.0  --   --   --   --   --   INR  --   --  1.17  --   --   --   --   --   HEPARINUNFRC  --   --   --   < >  --  0.55 0.61 0.35  CREATININE 1.01*  --  1.10*  --   --   --  1.20*  --   TROPONINI  --   < > 2.25*  --  3.17*  --  2.03*  --   < > = values in this interval not displayed.  Estimated Creatinine Clearance: 27.5 mL/min (A) (by C-G formula based on SCr of 1.2 mg/dL (H)).   Medical History: Past Medical History:  Diagnosis Date  . Abdominal pain, right upper quadrant   . At risk for falls   . Atherosclerosis of native arteries of extremities with rest pain, unspecified extremity (HCC)   . Candidiasis of breast   . DDD (degenerative disc disease), lumbar   . Dizziness   . Elevated BP   . Head injury   . Hypertension   . Obesity   . Pain of left lower extremity   .  Pelvic pain in female   . Synovial cyst of popliteal space   . Urinary frequency   . Urinary incontinence   . Urinary tract infection   . Vaginal atrophy   . Vaginal itching   . Wound of left leg     Medications:  Scheduled:  . chlorhexidine  15 mL Mouth Rinse BID  . furosemide  20 mg Intravenous Q12H  . mouth rinse  15 mL Mouth Rinse q12n4p  . metoprolol tartrate  12.5 mg Oral BID  . pantoprazole (PROTONIX) IV  40 mg Intravenous Q12H     Pharmacy will continue to monitor and adjust per consult.   Thomasene Rippleavid Zacariah Belue, PharmD, BCPS Clinical Pharmacist 07/06/2016

## 2016-07-07 DIAGNOSIS — I5021 Acute systolic (congestive) heart failure: Secondary | ICD-10-CM

## 2016-07-07 DIAGNOSIS — R748 Abnormal levels of other serum enzymes: Secondary | ICD-10-CM

## 2016-07-07 MED ORDER — MORPHINE SULFATE (PF) 2 MG/ML IV SOLN
1.0000 mg | INTRAVENOUS | Status: DC | PRN
  Administered 2016-07-07 – 2016-07-11 (×12): 2 mg via INTRAVENOUS
  Filled 2016-07-07 (×11): qty 1

## 2016-07-07 MED ORDER — LIDOCAINE HCL (CARDIAC) 20 MG/ML IV SOLN
INTRAVENOUS | Status: AC
  Filled 2016-07-07: qty 5

## 2016-07-07 MED ORDER — MORPHINE SULFATE 10 MG/5ML PO SOLN
5.0000 mg | Freq: Four times a day (QID) | ORAL | Status: DC
  Administered 2016-07-07 – 2016-07-08 (×4): 5 mg via ORAL
  Filled 2016-07-07 (×4): qty 5

## 2016-07-07 MED ORDER — MORPHINE SULFATE 10 MG/5ML PO SOLN: 5.0000 mg | ORAL | Status: DC

## 2016-07-07 MED ORDER — BLISTEX MEDICATED EX OINT: TOPICAL_OINTMENT | CUTANEOUS | Status: DC | PRN

## 2016-07-07 MED ORDER — MECLIZINE HCL 12.5 MG PO TABS
12.5000 mg | ORAL_TABLET | Freq: Three times a day (TID) | ORAL | Status: DC | PRN
  Administered 2016-07-08: 12.5 mg via ORAL
  Filled 2016-07-07 (×2): qty 2

## 2016-07-07 NOTE — Progress Notes (Addendum)
Daily Progress Note   Patient Name: Tiffany Jenkins       Date: 07/07/2016 DOB: 1923/05/24  Age: 83 y.o. MRN#: [id number] Attending Physician: Shaune Pollack, MD Primary Care Physician: Corky Downs, MD Admit Date: 06/15/2016  Reason for Consultation/Follow-up: Establishing goals of care and Psychosocial/spiritual support  Subjective: Patient denies pain.  She states she has lived a wonderful life - and has done everything a person could want to do, her family has lived thru everything a family could live thru.  She appears prepared for what is to come next.  She complains of motion sickness with any movement, but also states that she does not like taking medications and "I'm fine if I just stay still".     She complains of her nose and airway being dried out.  I spoke with her son outside the room.  We originally made a plan to meet at 2:00 pm with the family.   Then I received a call from [place] (dtr in law) who stated grand children are traveling in from out of state and the family is not ready to make any changes to her care today.  I was very direct with Lynden Ang that unfortunately her mother in law is not likely to improve or survive and that she will most likely die this hospitalization.   Lynden Ang stated that the family understands that - they are simply requesting more time for family to arrive and say their goodbyes.  Assessment: Extremely pleasant 83 yo female with NSTEMI and acute hypoxic respiratory failure who most likely will not survive to discharge from the hospital.  I suspect she will not be able to survive long without HFNC oxygen.   Patient Profile/HPI: 83 y.o. female  with past medical history of urinary incontinence, dysphagia secondary to esophageal strictures, hypertension,  degenerative disc disease, candidiasis of breast, dizziness, and atherosclerosis admitted on 06/19/2016 with right upper quadrant abdominal pain with radiation to back. In ED, troponin of 2.2, NSTEMI, and with acute hypoxic respiratory distress requiring BiPAP. CT angio reveals multifocal pneumonia, pulmonary edema, or ARDS. US abdomen negative for acute abnormality. ECHO on 5/22 with EF of 25-30%, severe LV dysfunction, dilated LA and moderate pulmonary hypertension. DNR. Now on high flow nasal cannula at 75% and 50L/min. Not a candidate for invasive cardiology workup.  Length of Stay: 3  Current Medications: Scheduled Meds:  . chlorhexidine  15 mL Mouth Rinse BID  . fentaNYL  12.5 mcg Transdermal Q72H  . furosemide  40 mg Oral Daily  . heparin subcutaneous  5,000 Units Subcutaneous Q12H  . hydrALAZINE  10 mg Oral Q8H  . mouth rinse  15 mL Mouth Rinse q12n4p  . metoprolol tartrate  12.5 mg Oral BID  . morphine  5 mg Oral Q4H  . nitroGLYCERIN  0.2 mg Transdermal Daily  . senna-docusate  1 tablet Oral BID    Continuous Infusions:   PRN Meds: acetaminophen **OR** acetaminophen, guaiFENesin-dextromethorphan, ipratropium-albuterol, lip balm, LORazepam, meclizine, morphine injection, [DISCONTINUED] ondansetron **OR** ondansetron (ZOFRAN) IV  Physical Exam        Elderly female, A&O, weak appearing, pleasant, appropriate, coherent CV slightly tachy Resp NAD on 50L HFNC Abdomen soft, nd   Vital Signs: BP 131/71   Pulse (!) 102   Temp 98.1 F (36.7 C) (Oral)   Resp 11   Ht 5\' 5"  (1.651 m)   Wt 65.8 kg (145 lb 1 oz)   SpO2 97%   BMI 24.14 kg/m  SpO2: SpO2: 97 % O2 Device: O2 Device: High Flow Nasal Cannula O2 Flow Rate: O2 Flow Rate (L/min): 45 L/min  Intake/output summary: No intake or output data in the 24 hours ending 07/07/16 1107 LBM: Last BM Date: 06/19/2016 Baseline Weight: Weight: 63.5 kg (140 lb) Most recent weight: Weight: 65.8 kg (145 lb 1 oz)       Palliative  Assessment/Data:    Flowsheet Rows     Most Recent Value  Intake Tab  Referral Department  Critical care  Unit at Time of Referral  ICU  Palliative Care Primary Diagnosis  Cardiac  Date Notified  07/06/16  Palliative Care Type  New Palliative care  Reason for referral  Clarify Goals of Care, Counsel Regarding Hospice  Date of Admission  07/12/2016  Date first seen by Palliative Care  07/06/16  # of days Palliative referral response time  0 Day(s)  # of days IP prior to Palliative referral  3  Clinical Assessment  Palliative Performance Scale Score  30%  Psychosocial & Spiritual Assessment  Palliative Care Outcomes  Patient/Family meeting held?  Yes  Who was at the meeting?  patient, husband, and multiple family members  Palliative Care Outcomes  Clarified goals of care, Counseled regarding hospice, Provided psychosocial or spiritual support, Provided end of life care assistance, Improved pain interventions      Patient Active Problem List   Diagnosis Date Noted  . Acute pulmonary edema (HCC)   . Acute respiratory failure (HCC)   . Palliative care by specialist   . Goals of care, counseling/discussion   . NSTEMI (non-ST elevated myocardial infarction) (HCC) 07/04/2016  . HTN (hypertension) 07/04/2016  . Overactive bladder 07/04/2016  . Recurrent UTI 06/27/2015  . Atrophic vaginitis 06/27/2015  . Incontinence 06/27/2015    Palliative Care Plan    Recommendations/Plan:   Remain on HFNC for the next 24-48 hours giving the family time to say their goodbyes.  Patient and family request that she be alert, consequently she refuses the majority of comfort meds  Once family gathers and says goodbye the medical team and/or PMT (PMT will return Monday) will speak with the family once again about transitioning to comfort measures only  Goals of Care and Additional Recommendations:  Limitations on Scope of Treatment: No escalation of care.    Code Status:  DNR  Prognosis:   Hours - Days   Discharge Planning:  Anticipated Hospital Death vs Home with Hospice Services.  Care plan was discussed with Son and Dtr in law, Bedside RN, Critical Care MD.  Thank you for allowing the Palliative Medicine Team to assist in the care of this patient.  Total time spent:  35 min.     Greater than 50%  of this time was spent counseling and coordinating care related to the above assessment and plan.  Algis Downs, PA-C Palliative Medicine  Please contact Palliative MedicineTeam phone at [phone] for questions and concerns between 7 am - 7 pm.   Please see AMION for individual provider pager numbers.

## 2016-07-07 NOTE — Care Management (Signed)
Per progression and palliative team family will meet to discuss comfort care today afternoon. Patient may transfer to 1C on comfort care pending that discussion.

## 2016-07-07 NOTE — Progress Notes (Signed)
Patient stated that she had a small amount of urine in her brief but requested to wait (to clean and change her brief) until she had a little more output.

## 2016-07-07 NOTE — Plan of Care (Signed)
Problem: Cardiac: Goal: Ability to achieve and maintain adequate cardiopulmonary perfusion will improve Outcome: Progressing Pt rested comfortably overnight with no complaints of pain. Pt continues to be incontinent and family at bedside.

## 2016-07-07 NOTE — Progress Notes (Signed)
   Name: Tiffany Jenkins MRN: [id number] DOB: 05/03/1923    ADMISSION DATE:  24-Apr-2016 CONSULTATION DATE: 07/04/2016  REFERRING MD : Dr. Anne HahnWillis  CHIEF COMPLAINT: Abdominal Pain    BRIEF PATIENT DESCRIPTION:  83 yo female admitted 05/21 with NSTEMI and acute hypoxic respiratory failure secondary to multifocal pneumonia vs. pulmonary edema vs. ARDS on CT Angio Chest results.05/22  SIGNIFICANT EVENTS  [facility]  05/22 transferred to med-surg but returned shortly thereafter for hypoxemic respiratory distress  STUDIES:  CT Angio Chest 05/22>>Examination is technically limited due to motion artifact. Diffuse vascular calcification. No evidence of aneurysm or dissection of the thoracic or abdominal aorta. Major vessels appear patent. Diffuse airspace consolidation throughout both lungs with small pleural effusions. This could be due to multifocal pneumonia, edema, or ARDS. No acute process demonstrated in the abdomen or pelvis. US Abd Limited 05/22>>No acute abnormality at the right upper quadrant. Small right pleural effusion noted. Multiple hepatic cysts again noted.  SUBJECTIVE:  Lethargic but cognition intact. Comfortable on high flow nasal cannula oxygen. Presently on 71% he did high flow  VITAL SIGNS: Temp:  [97.7 F (36.5 C)-98.2 F (36.8 C)] 98.1 F (36.7 C) (05/25 0800) Pulse Rate:  [62-115] 102 (05/25 0900) Resp:  [11-31] 11 (05/25 0900) BP: (99-131)/(52-89) 131/71 (05/25 0900) SpO2:  [88 %-97 %] 97 % (05/25 0900) FiO2 (%):  [77 %] 77 % (05/25 0700)  PHYSICAL EXAMINATION:  Gen: Frail appearing, no overt respiratory distress HEENT: NCAT, sclerae white, oropharynx normal Neck: NO LAN, no JVD noted Lungs: No wheezes, bilateral crackles Cardiovascular: RRR, no murmurs Abdomen: Soft, NT, +BS Ext: no C/C/E Neuro: No focal deficits Skin: No lesions noted    Recent Labs Lab 07/04/16 0627 07/05/16 0429 07/06/16 0503  NA 134* 135 137    K 4.3 3.9 4.1  CL 103 102 100*  CO2 21* 24 28  BUN 19 27* 35*  CREATININE 1.10* 1.20* 1.22*  GLUCOSE 144* 89 110*    Recent Labs Lab 07/04/16 0627 07/05/16 0429 07/06/16 0503  HGB 13.5 12.7 11.9*  HCT 40.4 37.8 35.3  WBC 17.3* 11.5* 11.1*  PLT 243 220 231   CXR: Improved bilateral airspace disease  ASSESSMENT / PLAN: Acute hypoxemic respiratory failure - Echocardiogram performed yesterday reveals severely reduced ejection fraction at 25-30%, most likely etiology of hypoxemic respiratory failure. Patient clinically not infected at this time NSTEMI - suspect stuttering ischemia LBBB AKI, nonoliguric, elevated BUN/creatinine ratio most likely reflects decreased effective renal plasma flow    PLAN/REC: Continue SDU level of care Supplemental O2 to maintain SpO2 > 90% Cont diuresis as permitted by BP and renal function Recheck troponin I in a.m. 05/24 Echocardiogram ordered - family very strongly wants to know the results of this test Cardiology following Monitor BMET intermittently Monitor I/Os Correct electrolytes as indicated Ongoing discussions with family regarding goals of care    Tora KindredJohn Phoebie Shad, DO  07/07/2016 9:12 AM

## 2016-07-07 NOTE — Progress Notes (Signed)
[employer] [facility] - [place] at [facility]   PATIENT NAME: Raynee Mccasland    MR#:  [id number]  DATE OF BIRTH:  [birth year]  SUBJECTIVE:  CHIEF COMPLAINT:   Chief Complaint  Patient presents with  . Abdominal Pain   Better cough and shortness of breath after morpine. Still on high flow oxygen.  REVIEW OF SYSTEMS:  Review of Systems  Constitutional: Positive for malaise/fatigue. Negative for chills and fever.  HENT: Positive for hearing loss. Negative for congestion and sinus pain.   Eyes: Negative for blurred vision and double vision.  Respiratory: Positive for cough and shortness of breath. Negative for hemoptysis, wheezing and stridor.   Cardiovascular: Negative for chest pain, palpitations and leg swelling.  Gastrointestinal: Negative for abdominal pain, blood in stool, diarrhea, melena, nausea and vomiting.  Genitourinary: Negative for dysuria and hematuria.  Musculoskeletal: Negative for back pain.  Skin: Negative for itching and rash.  Neurological: Positive for weakness. Negative for dizziness, focal weakness, loss of consciousness and headaches.  Psychiatric/Behavioral: Negative for depression. The patient is not nervous/anxious.     DRUG ALLERGIES:   Allergies  Allergen Reactions  . Codeine Nausea And Vomiting  . Penicillins Hives and Other (See Comments)    Has patient had a PCN reaction causing immediate rash, facial/tongue/throat swelling, SOB or lightheadedness with hypotension: Unknown Has patient had a PCN reaction causing severe rash involving mucus membranes or skin necrosis: Unknown Has patient had a PCN reaction that required hospitalization: Unknown Has patient had a PCN reaction occurring within the last 10 years: No If all of the above answers are "NO", then may proceed with Cephalosporin use.   . Sulfa Antibiotics Other (See Comments)    Reaction: unknown   VITALS:  Blood pressure (!) 111/57, pulse (!) 102, temperature 98.1 F (36.7 C),  temperature source Oral, resp. rate (!) 21, height 5\' 5"  (1.651 m), weight 145 lb 1 oz (65.8 kg), SpO2 91 %. PHYSICAL EXAMINATION:  Physical Exam  Constitutional: She is oriented to person, place, and time.  Critical and fragile looking  HENT:  Head: Normocephalic.  Mouth/Throat: Oropharynx is clear and moist.  Eyes: Conjunctivae and EOM are normal. No scleral icterus.  Neck: Normal range of motion. Neck supple. No JVD present. No tracheal deviation present.  Cardiovascular: Normal rate, regular rhythm and normal heart sounds.  Exam reveals no gallop.   No murmur heard. Pulmonary/Chest: Effort normal. No respiratory distress. She has no wheezes. She has rales.  Abdominal: Soft. Bowel sounds are normal. She exhibits no distension. There is no tenderness.  Musculoskeletal: Normal range of motion. She exhibits no tenderness.  Neurological: She is alert and oriented to person, place, and time. No cranial nerve deficit.  Skin: No rash noted. No erythema.  Psychiatric: Affect normal.   LABORATORY PANEL:  Female CBC  Recent Labs Lab 07/06/16 0503  WBC 11.1*  HGB 11.9*  HCT 35.3  PLT 231   ------------------------------------------------------------------------------------------------------------------ Chemistries   Recent Labs Lab 07/12/2016 1833  07/06/16 0503  NA 134*  < > 137  K 4.5  < > 4.1  CL 105  < > 100*  CO2 20*  < > 28  GLUCOSE 113*  < > 110*  BUN 15  < > 35*  CREATININE 1.01*  < > 1.22*  CALCIUM 9.0  < > 8.4*  AST 41  --   --   ALT 19  --   --   ALKPHOS 58  --   --  BILITOT 1.3*  --   --   < > = values in this interval not displayed. RADIOLOGY:  No results found. ASSESSMENT AND PLAN:   Acute hypoxemic respiratory failure due to acute CHF. The patient is off BiPAP, but still on high flow oxygen. Continue nebulizer and antibiotics was discontinued.   Acute systolic CHF. LV EF: 25% -   30%. Continue Lasix IV twice a day, follow-up echocardiogram and  cardiology consult.  NSTEMI, heparin drip is discontinued. echocardiogram LV EF: 25% -   30%.  HTN (hypertension) - stable, continue home meds  Very poor prognosis. Per palliative care consult, Remain on HFNC for the next 24-48 hours giving the family time to say their goodbyes. Anticipated Hospital Death vs Home with Hospice Services. All the records are reviewed and case discussed with Care Management/Social Worker. Management plans discussed with the patient, her daughter and grandson and they are in agreement.  CODE STATUS: DNR  TOTAL TIME TAKING CARE OF THIS PATIENT: 38 minutes.   More than 50% of the time was spent in counseling/coordination of care: YES  POSSIBLE D/C IN ? DAYS, DEPENDING ON CLINICAL CONDITION.   Shaune Pollackhen, Sheretta Grumbine M.D on 07/07/2016 at 3:12 PM  Between 7am to 6pm - Pager - [phone]  After 6pm go to [email or web address] - Social research officer, governmentpassword [email or web address]  [facility] [place] [facility]  Office  [phone]  CC: Primary care physician; Corky DownsMasoud, Javed, MD  Note: This dictation was prepared with Dragon dictation along with smaller phrase technology. Any transcriptional errors that result from this process are unintentional.

## 2016-07-08 MED ORDER — SODIUM CHLORIDE 0.9 % IV SOLN: 1.0000 mg/h | INTRAVENOUS | Status: DC

## 2016-07-08 MED ORDER — LORAZEPAM 2 MG/ML IJ SOLN
1.0000 mg | INTRAMUSCULAR | Status: DC | PRN
  Administered 2016-07-08 – 2016-07-11 (×3): 1 mg via INTRAVENOUS
  Filled 2016-07-08 (×3): qty 1

## 2016-07-08 MED ORDER — MIDAZOLAM HCL 5 MG/ML IJ SOLN
1.0000 mg/h | INTRAMUSCULAR | Status: DC
  Administered 2016-07-08: 1 mg/h via INTRAVENOUS
  Filled 2016-07-08: qty 10

## 2016-07-08 MED ORDER — MORPHINE 100MG IN NS 100ML (1MG/ML) PREMIX INFUSION
3.0000 mg/h | INTRAVENOUS | Status: DC
  Administered 2016-07-08: 1 mg/h via INTRAVENOUS
  Administered 2016-07-09: 2 mg/h via INTRAVENOUS
  Administered 2016-07-10 (×2): 4 mg/h via INTRAVENOUS
  Filled 2016-07-08 (×2): qty 100

## 2016-07-08 NOTE — Progress Notes (Signed)
Patient resting in bed with daughter at bedside. Patient requested PRN pain medicine one time this shift. Poor appetite, however, she will eat ice cream. Patient states that she is comfortable right now.

## 2016-07-08 NOTE — Progress Notes (Signed)
Spoke in length with patient's daughter regarding disease process and medications for comfort that are ordered. Routed daughter to ask any further questions to MD.

## 2016-07-08 NOTE — Progress Notes (Signed)
   Name: Tiffany Jenkins MRN: [id number] DOB: 04/28/1923    ADMISSION DATE:  07/08/2016 CONSULTATION DATE: 07/04/2016  REFERRING MD : Dr. Anne HahnWillis  CHIEF COMPLAINT: Abdominal Pain    BRIEF PATIENT DESCRIPTION:  83 yo female admitted 05/21 with NSTEMI and acute hypoxic respiratory failure secondary to multifocal pneumonia vs. pulmonary edema vs. ARDS on CT Angio Chest results.05/22  SIGNIFICANT EVENTS  05/22-[facility]  05/22 transferred to med-surg but returned shortly thereafter for hypoxemic respiratory distress  STUDIES:  CT Angio Chest 05/22>>Examination is technically limited due to motion artifact. Diffuse vascular calcification. No evidence of aneurysm or dissection of the thoracic or abdominal aorta. Major vessels appear patent. Diffuse airspace consolidation throughout both lungs with small pleural effusions. This could be due to multifocal pneumonia, edema, or ARDS. No acute process demonstrated in the abdomen or pelvis. US Abd Limited 05/22>>No acute abnormality at the right upper quadrant. Small right pleural effusion noted. Multiple hepatic cysts again noted.  SUBJECTIVE:  Lethargic but cognition intact. Comfortable on high flow nasal cannula oxygen. Presently on 71% he did high flow  VITAL SIGNS: Temp:  [97.9 F (36.6 C)-98.5 F (36.9 C)] 98.5 F (36.9 C) (05/26 0200) Pulse Rate:  [86-117] 88 (05/26 0800) Resp:  [11-26] 18 (05/26 0800) BP: (97-131)/(54-99) 107/54 (05/26 0800) SpO2:  [89 %-97 %] 95 % (05/26 0831) FiO2 (%):  [45 %-55 %] 50 % (05/26 0831)  PHYSICAL EXAMINATION:  Gen: Frail appearing, no overt respiratory distress HEENT: NCAT, sclerae white, oropharynx normal Neck: NO LAN, no JVD noted Lungs: No wheezes, bilateral crackles Cardiovascular: RRR, no murmurs Abdomen: Soft, NT, +BS Ext: no C/C/E Neuro: No focal deficits Skin: No lesions noted    Recent Labs Lab 07/04/16 0627 07/05/16 0429 07/06/16 0503  NA 134* 135  137  K 4.3 3.9 4.1  CL 103 102 100*  CO2 21* 24 28  BUN 19 27* 35*  CREATININE 1.10* 1.20* 1.22*  GLUCOSE 144* 89 110*    Recent Labs Lab 07/04/16 0627 07/05/16 0429 07/06/16 0503  HGB 13.5 12.7 11.9*  HCT 40.4 37.8 35.3  WBC 17.3* 11.5* 11.1*  PLT 243 220 231   CXR: Improved bilateral airspace disease  ASSESSMENT / PLAN: Acute hypoxemic respiratory failure - Echocardiogram performed yesterday reveals severely reduced ejection fraction at 25-30%, most likely etiology of hypoxemic respiratory failure. Patient clinically not infected at this time Family did not wish to meet with palliative care yesterday. Still requiring high FiO2 on high flow oxygen. Complaining of some shortness of breath today.  NSTEMI - suspect stuttering ischemia LBBB  AKI, nonoliguric, elevated BUN/creatinine ratio most likely reflects decreased effective renal plasma flow    PLAN/REC: Continue SDU level of care Supplemental O2 to maintain SpO2 > 90% Cont diuresis as permitted by BP and renal function Recheck troponin I in a.m. 05/24 Echocardiogram ordered - family very strongly wants to know the results of this test Cardiology following Monitor BMET intermittently Monitor I/Os Correct electrolytes as indicated Ongoing discussions with family regarding goals of care    Tora KindredJohn Dorrian Doggett, DO  07/08/2016 8:33 AM

## 2016-07-08 NOTE — Progress Notes (Signed)
Called to patients room pt c/o not geyying enough air. Increased pts FIO2 from 45% to 50% also increased pts flow from 45L to 50L.

## 2016-07-08 NOTE — Progress Notes (Signed)
Pulmonary/critical care  Family meeting. Both patient and family wish to transition to comfort/palliative care. We will transition from high flow to nasal cannula We'll place on a morphine and Versed infusion for pain, comfort, air hunger We will provide as needed boluses of morphine and Ativan for comfort Nurse present and in agreement  Tora KindredJohn Kenzley Ke, D.O.

## 2016-07-08 NOTE — Progress Notes (Signed)
Patient's grandchildren from [place] were in tonight to visit earlier this shift.

## 2016-07-08 NOTE — Progress Notes (Signed)
[employer] [facility] - [place] at [facility]   PATIENT NAME: Tiffany Jenkins    MR#:  [id number]  DATE OF BIRTH:  [birth year]  SUBJECTIVE:  CHIEF COMPLAINT:   Chief Complaint  Patient presents with  . Abdominal Pain   has cough and shortness of breath. Still on high flow oxygen.  REVIEW OF SYSTEMS:  Review of Systems  Constitutional: Positive for malaise/fatigue. Negative for chills and fever.  HENT: Positive for hearing loss. Negative for congestion and sinus pain.   Eyes: Negative for blurred vision and double vision.  Respiratory: Positive for cough and shortness of breath. Negative for hemoptysis, wheezing and stridor.   Cardiovascular: Negative for chest pain, palpitations and leg swelling.  Gastrointestinal: Negative for abdominal pain, blood in stool, diarrhea, melena, nausea and vomiting.  Genitourinary: Negative for dysuria and hematuria.  Musculoskeletal: Negative for back pain.  Skin: Negative for itching and rash.  Neurological: Positive for weakness. Negative for dizziness, focal weakness, loss of consciousness and headaches.  Psychiatric/Behavioral: Negative for depression. The patient is not nervous/anxious.     DRUG ALLERGIES:   Allergies  Allergen Reactions  . Codeine Nausea And Vomiting  . Penicillins Hives and Other (See Comments)    Has patient had a PCN reaction causing immediate rash, facial/tongue/throat swelling, SOB or lightheadedness with hypotension: Unknown Has patient had a PCN reaction causing severe rash involving mucus membranes or skin necrosis: Unknown Has patient had a PCN reaction that required hospitalization: Unknown Has patient had a PCN reaction occurring within the last 10 years: No If all of the above answers are "NO", then may proceed with Cephalosporin use.   . Sulfa Antibiotics Other (See Comments)    Reaction: unknown   VITALS:  Blood pressure (!) 107/54, pulse 88, temperature 98.5 F (36.9 C), temperature source  Oral, resp. rate 18, height 5\' 5"  (1.651 m), weight 145 lb 1 oz (65.8 kg), SpO2 95 %. PHYSICAL EXAMINATION:  Physical Exam  Constitutional: She is oriented to person, place, and time.  Critical and fragile looking  HENT:  Head: Normocephalic.  Mouth/Throat: Oropharynx is clear and moist.  Eyes: Conjunctivae and EOM are normal. No scleral icterus.  Neck: Normal range of motion. Neck supple. No JVD present. No tracheal deviation present.  Cardiovascular: Normal rate, regular rhythm and normal heart sounds.  Exam reveals no gallop.   No murmur heard. Pulmonary/Chest: Effort normal. No respiratory distress. She has no wheezes. She has rales.  Abdominal: Soft. Bowel sounds are normal. She exhibits no distension. There is no tenderness.  Musculoskeletal: Normal range of motion. She exhibits no tenderness.  Neurological: She is alert and oriented to person, place, and time. No cranial nerve deficit.  Skin: No rash noted. No erythema.  Psychiatric: Affect normal.   LABORATORY PANEL:  Female CBC  Recent Labs Lab 07/06/16 0503  WBC 11.1*  HGB 11.9*  HCT 35.3  PLT 231   ------------------------------------------------------------------------------------------------------------------ Chemistries   Recent Labs Lab 07/10/2016 1833  07/06/16 0503  NA 134*  < > 137  K 4.5  < > 4.1  CL 105  < > 100*  CO2 20*  < > 28  GLUCOSE 113*  < > 110*  BUN 15  < > 35*  CREATININE 1.01*  < > 1.22*  CALCIUM 9.0  < > 8.4*  AST 41  --   --   ALT 19  --   --   ALKPHOS 58  --   --   BILITOT 1.3*  --   --   < > =  values in this interval not displayed. RADIOLOGY:  No results found. ASSESSMENT AND PLAN:   Acute hypoxemic respiratory failure due to acute CHF. The patient is off BiPAP, but still on high flow oxygen. Continue nebulizer and antibiotics was discontinued.   Acute systolic CHF. LV EF: 25% -   30%. Continue Lasix IV.Marland Kitchen.  NSTEMI, heparin drip is discontinued. echocardiogram LV EF: 25% -    30%.  HTN (hypertension) - stable, continue home meds  Very poor prognosis. The patient daughter said every family members visited the patient. They decided transition to comfort care.  All the records are reviewed and case discussed with Care Management/Social Worker. Management plans discussed with the patient, her daughter and they are in agreement.  CODE STATUS: DNR  TOTAL TIME TAKING CARE OF THIS PATIENT: 37 minutes.   More than 50% of the time was spent in counseling/coordination of care: YES  POSSIBLE D/C IN ? DAYS, DEPENDING ON CLINICAL CONDITION.   Shaune Pollackhen, Sherisse Fullilove Saadeh M.D on 07/08/2016 at 12:16 PM  Between 7am to 6pm - Pager - [phone]  After 6pm go to [email or web address] - Social research officer, governmentpassword EPAS [email or web address]  [facility] [place] [facility]  Office  [phone]  CC: Primary care physician; Corky DownsMasoud, Javed, MD  Note: This dictation was prepared with Dragon dictation along with smaller phrase technology. Any transcriptional errors that result from this process are unintentional.

## 2016-07-08 NOTE — Progress Notes (Signed)
Patient resting quietly.  Morphine and Versed drip infusing.  Resp. Regular unlabored.  Family at bedside.  Support given.

## 2016-07-09 NOTE — Progress Notes (Signed)
Patients children at bedside with patient.  They report patient seems restless, and in pain. Pt intermittently talking about dying, and going to heaven.  Pt bolused 2mg  morpine from gtt.

## 2016-07-09 NOTE — Plan of Care (Signed)
Problem: Activity: Goal: Ability to tolerate increased activity will improve Outcome: Not Applicable Date Met: 50/41/36 Pt currently on comfort care.

## 2016-07-09 NOTE — Progress Notes (Signed)
Pulmonary/critical care  Patient made comfort care yesterday. Has been on morphine and Versed with as needed boluses. Is presently on supplemental oxygen he did high flow Patient has been comfortable overnight per family There pleased so for with her overall level of pain control and comfort. At this point will change to nasal cannula and transferred to the floor so that she has a bigger room and family members make him more freely.  Tora KindredJohn Zackari Ruane, D.O.

## 2016-07-09 NOTE — Progress Notes (Signed)
Status remains unchanged.  Family member at bedside.  Emotional support given.

## 2016-07-09 NOTE — Progress Notes (Signed)
No change in status. Family member at bedside.  Emotional support given.

## 2016-07-09 NOTE — Progress Notes (Signed)
Report to 1C RN.  Denies questions.  Pt to be transported at this time.

## 2016-07-09 NOTE — Plan of Care (Signed)
Problem: Pain Managment: Goal: General experience of comfort will improve Outcome: Progressing Pt transferred today from ICU. Pt is on comfort care. Morphine drip infusing. Morphine and ativan IV push given once with improvement. Pt drowsy, responded to voice. Family at the bedside.

## 2016-07-09 NOTE — Progress Notes (Signed)
[employer] - [place] at [facility]   PATIENT NAME: Tiffany Jenkins    MR#:  [id number]  DATE OF BIRTH:  19-Mar-1923  SUBJECTIVE:  CHIEF COMPLAINT:   Chief Complaint  Patient presents with  . Abdominal Pain   The patient is drowsy. Off high flow oxygen, on O2 [place] and comfort care.  REVIEW OF SYSTEMS:  Review of Systems  Unable to perform ROS: Critical illness    DRUG ALLERGIES:   Allergies  Allergen Reactions  . Codeine Nausea And Vomiting  . Penicillins Hives and Other (See Comments)    Has patient had a PCN reaction causing immediate rash, facial/tongue/throat swelling, SOB or lightheadedness with hypotension: Unknown Has patient had a PCN reaction causing severe rash involving mucus membranes or skin necrosis: Unknown Has patient had a PCN reaction that required hospitalization: Unknown Has patient had a PCN reaction occurring within the last 10 years: No If all of the above answers are "NO", then may proceed with Cephalosporin use.   . Sulfa Antibiotics Other (See Comments)    Reaction: unknown   VITALS:  Blood pressure 125/70, pulse (!) 102, temperature 98.5 F (36.9 C), temperature source Oral, resp. rate (!) 21, height 5\' 5"  (1.651 m), weight 145 lb 1 oz (65.8 kg), SpO2 95 %. PHYSICAL EXAMINATION:  Physical Exam  Constitutional: No distress.  Critical and fragile looking  HENT:  Head: Normocephalic.  Eyes: Conjunctivae and EOM are normal. No scleral icterus.  Neck: Normal range of motion. Neck supple. No JVD present. No tracheal deviation present.  Cardiovascular: Normal rate, regular rhythm and normal heart sounds.  Exam reveals no gallop.   No murmur heard. Pulmonary/Chest: Effort normal. No respiratory distress. She has no wheezes. She has rales.  Abdominal: Soft. Bowel sounds are normal. She exhibits no distension. There is no tenderness.  Neurological: No cranial nerve deficit.  drowsy  Skin: No rash noted. No erythema.   LABORATORY  PANEL:  Female CBC  Recent Labs Lab 07/06/16 0503  WBC 11.1*  HGB 11.9*  HCT 35.3  PLT 231   ------------------------------------------------------------------------------------------------------------------ Chemistries   Recent Labs Lab 06/27/2016 1833  07/06/16 0503  NA 134*  < > 137  K 4.5  < > 4.1  CL 105  < > 100*  CO2 20*  < > 28  GLUCOSE 113*  < > 110*  BUN 15  < > 35*  CREATININE 1.01*  < > 1.22*  CALCIUM 9.0  < > 8.4*  AST 41  --   --   ALT 19  --   --   ALKPHOS 58  --   --   BILITOT 1.3*  --   --   < > = values in this interval not displayed. RADIOLOGY:  No results found. ASSESSMENT AND PLAN:   Acute hypoxemic respiratory failure due to acute CHF. The patient is off high flow oxygen. On O2 [place] for comfort care. nebulizer and antibiotics was discontinued.   Acute systolic CHF. LV EF: 25% -   30%. Continue Lasix IV.Marland Kitchen  NSTEMI, heparin drip was discontinued. echocardiogram LV EF: 25% -   30%.  HTN (hypertension), discontinued home meds  Very poor prognosis. The patient is in comfort care. She may die in the hospital soon.  All the records are reviewed and case discussed with Care Management/Social Worker. Management plans discussed with the patient's daughters and they are in agreement.  CODE STATUS: DNR  TOTAL TIME TAKING CARE OF THIS PATIENT: 37 minutes.  More than 50% of the time was spent in counseling/coordination of care: YES  POSSIBLE D/C IN ? DAYS, DEPENDING ON CLINICAL CONDITION.   Shaune Pollackhen, Jakeline Dave M.D on 07/09/2016 at 10:29 AM  Between 7am to 6pm - Pager - [phone]  After 6pm go to [email or web address] - Social research officer, governmentpassword EPAS [email or web address]  [facility] [place] [facility]  Office  [phone]  CC: Primary care physician; Corky DownsMasoud, Javed, MD  Note: This dictation was prepared with Dragon dictation along with smaller phrase technology. Any transcriptional errors that result from this process are unintentional.

## 2016-07-09 NOTE — Progress Notes (Signed)
Pt care assumed. Report received

## 2016-07-10 DIAGNOSIS — Z515 Encounter for palliative care: Secondary | ICD-10-CM

## 2016-07-10 MED ORDER — LORAZEPAM 2 MG/ML IJ SOLN
0.5000 mg | Freq: Two times a day (BID) | INTRAMUSCULAR | Status: DC
  Administered 2016-07-10 (×2): 0.5 mg via INTRAVENOUS
  Filled 2016-07-10 (×2): qty 1

## 2016-07-10 MED ORDER — LORAZEPAM 2 MG/ML IJ SOLN: 0.5000 mg | Freq: Two times a day (BID) | INTRAMUSCULAR | Status: DC

## 2016-07-10 NOTE — Progress Notes (Signed)
Daily Progress Note   Patient Name: Tiffany Jenkins       Date: 07/10/2016 DOB: 1923-11-03  Age: 83 y.o. MRN#: [id number] Attending Physician: Shaune Pollack, MD Primary Care Physician: Corky Downs, MD Admit Date: 06/26/2016  Reason for Consultation/Follow-up: Pain control, Psychosocial/spiritual support and Terminal Care  Subjective: Patient complains of back pain that is chronic and continuous.   Family requests patient be allowed to have butter milk.   Patient states, "I would like to drink some butter milk before I leave here".  Per bedside RN patient's husband has now been admitted as well with pneumonia.  Assessment: 83 yo extremely pleasant female.  Now actively dying after large cardiac event.  She is on 2L of oxygen and 1mg /hr morphine gtt.  Very lethargic.   Patient Profile/HPI:  83 y.o.femalewith past medical history of urinary incontinence, dysphagia secondary to esophageal strictures, hypertension, degenerative disc disease, candidiasis of breast, dizziness, and atherosclerosisadmitted on 5/21/2018with right upper quadrant abdominal pain with radiation to back. In ED, troponin of 2.2, NSTEMI, and with acute hypoxic respiratory distress requiring BiPAP. CT angio reveals multifocal pneumonia, pulmonary edema, or ARDS. US abdomen negative for acute abnormality. ECHO on 5/22 with EF of 25-30%, severe LV dysfunction, dilated LA and moderate pulmonary hypertension. DNR. Now on high flow nasal cannula at 75% and 50L/min. Not a candidate for invasive cardiology workup.  Length of Stay: 6  Current Medications: Scheduled Meds:    Continuous Infusions: . morphine 1 mg/hr (07/10/16 0759)    PRN Meds: lip balm, LORazepam, meclizine, morphine injection  Physical Exam        2 of 6   Vital Signs: BP (!) 119/56 (BP Location: Left Arm)   Pulse (!) 103   Temp 97.7 F (36.5 C) (Oral)   Resp 16   Ht 5\' 5"  (1.651 m)   Wt 65.8 kg (145 lb 1 oz)   SpO2 96%   BMI 24.14 kg/m  SpO2: SpO2: 96 % O2 Device: O2 Device: Nasal Cannula O2 Flow Rate: O2 Flow Rate (L/min): 2 L/min  Intake/output summary:   Intake/Output Summary (Last 24 hours) at 07/10/16 0942 Last data filed at 07/09/16 1913  Gross per 24 hour  Intake                0 ml  Output  825 ml  Net             -825 ml   LBM: Last BM Date: 03/06/2016 Baseline Weight: Weight: 63.5 kg (140 lb) Most recent weight: Weight: 65.8 kg (145 lb 1 oz)       Palliative Assessment/Data:    Flowsheet Rows     Most Recent Value  Intake Tab  Referral Department  Critical care  Unit at Time of Referral  ICU  Palliative Care Primary Diagnosis  Cardiac  Date Notified  07/06/16  Palliative Care Type  New Palliative care  Reason for referral  Clarify Goals of Care, Counsel Regarding Hospice  Date of Admission  03/06/2016  Date first seen by Palliative Care  07/06/16  # of days Palliative referral response time  0 Day(s)  # of days IP prior to Palliative referral  3  Clinical Assessment  Palliative Performance Scale Score  30%  Psychosocial & Spiritual Assessment  Palliative Care Outcomes  Patient/Family meeting held?  Yes  Who was at the meeting?  patient, husband, and multiple family members  Palliative Care Outcomes  Clarified goals of care, Counseled regarding hospice, Provided psychosocial or spiritual support, Provided end of life care assistance, Improved pain interventions      Patient Active Problem List   Diagnosis Date Noted  . Acute pulmonary edema (HCC)   . Acute respiratory failure (HCC)   . Palliative care by specialist   . Goals of care, counseling/discussion   . NSTEMI (non-ST elevated myocardial infarction) (HCC) 07/04/2016  . HTN (hypertension) 07/04/2016  . Overactive bladder  07/04/2016  . Recurrent UTI 06/27/2015  . Atrophic vaginitis 06/27/2015  . Incontinence 06/27/2015    Palliative Care Plan    Recommendations/Plan:  Full comfort care - patient appears comfortable.  Increased morphine gtt to 3-10 mg/ hr due to back pain.   Fentanyl patch was removed.  Family is agreeable to wean off oxygen today.  Family requests benzodiazepine to relax patient - she is anxious per family.  Changed orders to allow comfort feeds.  Goals of Care and Additional Recommendations:  Limitations on Scope of Treatment: Full Comfort Care  Code Status:  DNR  Prognosis:   Hours - Days   Discharge Planning:  Anticipated Hospital Death - Patient is adamantly opposed to hospice house.  Care plan was discussed with family and bedside RN  Thank you for allowing the Palliative Medicine Team to assist in the care of this patient.  Total time spent:  35. Min.     Greater than 50%  of this time was spent counseling and coordinating care related to the above assessment and plan.  Tiffany DownsMarianne York, PA-C Palliative Medicine  Please contact Palliative MedicineTeam phone at [phone] for questions and concerns between 7 am - 7 pm.   Please see AMION for individual provider pager numbers.

## 2016-07-10 NOTE — Care Management Important Message (Signed)
Important Message  Patient Details  Name: Tiffany Jenkins MRN: [id number] Date of Birth: 01/26/1924   Medicare Important Message Given:  Yes    Gwenette GreetBrenda S Brice Kossman, RN 07/10/2016, 9:14 AM

## 2016-07-10 NOTE — Progress Notes (Signed)
[facility] - [place] at [facility]   PATIENT NAME: Tiffany GoingLouise Jenkins    MR#:  [id number]  DATE OF BIRTH:  08/17/1923  SUBJECTIVE:  CHIEF COMPLAINT:   Chief Complaint  Patient presents with  . Abdominal Pain   The patient is drowsy. on O2 [place] and comfort care.  REVIEW OF SYSTEMS:  Review of Systems  Unable to perform ROS: Critical illness    DRUG ALLERGIES:   Allergies  Allergen Reactions  . Codeine Nausea And Vomiting  . Penicillins Hives and Other (See Comments)    Has patient had a PCN reaction causing immediate rash, facial/tongue/throat swelling, SOB or lightheadedness with hypotension: Unknown Has patient had a PCN reaction causing severe rash involving mucus membranes or skin necrosis: Unknown Has patient had a PCN reaction that required hospitalization: Unknown Has patient had a PCN reaction occurring within the last 10 years: No If all of the above answers are "NO", then may proceed with Cephalosporin use.   . Sulfa Antibiotics Other (See Comments)    Reaction: unknown   VITALS:  Blood pressure (!) 119/56, pulse (!) 103, temperature 97.7 F (36.5 C), temperature source Oral, resp. rate 16, height 5\' 5"  (1.651 m), weight 145 lb 1 oz (65.8 kg), SpO2 96 %. PHYSICAL EXAMINATION:  Physical Exam  Constitutional: No distress.  Critical and fragile looking  HENT:  Head: Normocephalic.  Eyes: Conjunctivae and EOM are normal. No scleral icterus.  Neck: Normal range of motion. Neck supple. No JVD present. No tracheal deviation present.  Cardiovascular: Normal rate, regular rhythm and normal heart sounds.  Exam reveals no gallop.   No murmur heard. Pulmonary/Chest: Effort normal. No respiratory distress. She has no wheezes. She has rales.  Abdominal: Soft. Bowel sounds are normal. She exhibits no distension. There is no tenderness.  Neurological: No cranial nerve deficit.  drowsy  Skin: No rash noted. No erythema.   LABORATORY PANEL:   Female CBC  Recent Labs Lab 07/06/16 0503  WBC 11.1*  HGB 11.9*  HCT 35.3  PLT 231   ------------------------------------------------------------------------------------------------------------------ Chemistries   Recent Labs Lab 06/17/2016 1833  07/06/16 0503  NA 134*  < > 137  K 4.5  < > 4.1  CL 105  < > 100*  CO2 20*  < > 28  GLUCOSE 113*  < > 110*  BUN 15  < > 35*  CREATININE 1.01*  < > 1.22*  CALCIUM 9.0  < > 8.4*  AST 41  --   --   ALT 19  --   --   ALKPHOS 58  --   --   BILITOT 1.3*  --   --   < > = values in this interval not displayed. RADIOLOGY:  No results found. ASSESSMENT AND PLAN:   Acute hypoxemic respiratory failure due to acute CHF. The patient is off high flow oxygen. On O2 [place] for comfort care. nebulizer and antibiotics was discontinued.   Acute systolic CHF. LV EF: 25% -   30%. discontinued Lasix IV.Marland Kitchen.  NSTEMI, heparin drip was discontinued. echocardiogram LV EF: 25% -   30%.  HTN (hypertension), discontinued home meds  Very poor prognosis. The patient is in comfort care. She may die in the hospital soon.  All the records are reviewed and case discussed with Care Management/Social Worker. Management plans discussed with the patient's daughter and son, and they are in agreement.  CODE STATUS: DNR  TOTAL TIME TAKING CARE OF THIS PATIENT: 33 minutes.   More  than 50% of the time was spent in counseling/coordination of care: YES  POSSIBLE D/C IN ? DAYS, DEPENDING ON CLINICAL CONDITION.   Shaune Pollack M.D on 07/10/2016 at 11:34 AM  Between 7am to 6pm - Pager - [phone]  After 6pm go to [email or web address] - Social research officer, government  [facility] [place] [facility]  Office  [phone]  CC: Primary care physician; Corky Downs, MD  Note: This dictation was prepared with Dragon dictation along with smaller phrase technology. Any transcriptional errors that result from this process are unintentional.

## 2016-07-11 DIAGNOSIS — Z515 Encounter for palliative care: Secondary | ICD-10-CM

## 2016-07-11 DIAGNOSIS — N289 Disorder of kidney and ureter, unspecified: Secondary | ICD-10-CM

## 2016-07-11 DIAGNOSIS — D72829 Elevated white blood cell count, unspecified: Secondary | ICD-10-CM

## 2016-07-11 DIAGNOSIS — I959 Hypotension, unspecified: Secondary | ICD-10-CM

## 2016-07-11 DIAGNOSIS — E871 Hypo-osmolality and hyponatremia: Secondary | ICD-10-CM

## 2016-07-11 DIAGNOSIS — I5021 Acute systolic (congestive) heart failure: Secondary | ICD-10-CM

## 2016-07-14 NOTE — Discharge Summary (Deleted)
[facility] - [place] at [facility]   PATIENT NAME: Tiffany Jenkins    MR#:  [id number]  DATE OF BIRTH:  Mar 13, 1923  DATE OF ADMISSION:  Jul 22, 2016 ADMITTING PHYSICIAN: Oralia Manis, MD  DATE OF DISCHARGE: No discharge date for patient encounter.  PRIMARY CARE PHYSICIAN: Corky Downs, MD     ADMISSION DIAGNOSIS:  Acute pulmonary edema (HCC) [J81.0] Elevated troponin [R74.8] NSTEMI (non-ST elevated myocardial infarction) (HCC) [I21.4] Abdominal pain [R10.9]  DISCHARGE DIAGNOSIS:  Principal Problem:   NSTEMI (non-ST elevated myocardial infarction) (HCC) Active Problems:   HTN (hypertension)   Overactive bladder   Acute pulmonary edema (HCC)   Acute respiratory failure (HCC)   Palliative care by specialist   Goals of care, counseling/discussion   Terminal care   Palliative care encounter   Acute systolic CHF (congestive heart failure) (HCC)   Hyponatremia   Hypotension   Acute renal insufficiency   Leukocytosis   SECONDARY DIAGNOSIS:   Past Medical History:  Diagnosis Date  . Abdominal pain, right upper quadrant   . At risk for falls   . Atherosclerosis of native arteries of extremities with rest pain, unspecified extremity (HCC)   . Candidiasis of breast   . DDD (degenerative disc disease), lumbar   . Dizziness   . Elevated BP   . Head injury   . Hypertension   . Obesity   . Pain of left lower extremity   . Pelvic pain in female   . Synovial cyst of popliteal space   . Urinary frequency   . Urinary incontinence   . Urinary tract infection   . Vaginal atrophy   . Vaginal itching   . Wound of left leg     .pro HOSPITAL COURSE:   The patient has 83 year old female with past medical history significant for history of hypertension, obesity, peripheral vascular disease, who presented to the hospital with complaints of epigastric and right upper quadrant abdominal pain. On arrival to the hospital, she was noted to have elevated  troponin. Right upper quadrant ultrasound was unremarkable. Hospitalist services. Admitted patient for non-STEMI. Initial chest x-ray revealed pulmonary edema. Echocardiogram done during this admission showed ejection fraction of 25-30%, mild LVH, diastolic dysfunction. Patient was consulted by critical care physicians, patient's family discussed with physicians patient's care and decided on comfort care measures. Patient was initiated on comfort care measures and expired shortly after.  Discussion by problem:  #1 Acute hypoxemic respiratory failure due to acute CHF. The patient is off high flow oxygen. On O2 [place] for comfort care. nebulizer and antibiotics was discontinued.   #2 Acute systolic CHF. LV EF: 25% - 30%. discontinued Lasix IV.Marland Kitchen  #3 NSTEMI, heparin drip was discontinued. echocardiogram LV EF: 25% - 30%. Supportive care only  #4 HTN (hypertension), now hypotensive, of home meds   DISCHARGE CONDITIONS:   Patient expired  CONSULTS OBTAINED:  Treatment Team:  Alwyn Pea, MD  DRUG ALLERGIES:   Allergies  Allergen Reactions  . Codeine Nausea And Vomiting  . Penicillins Hives and Other (See Comments)    Has patient had a PCN reaction causing immediate rash, facial/tongue/throat swelling, SOB or lightheadedness with hypotension: Unknown Has patient had a PCN reaction causing severe rash involving mucus membranes or skin necrosis: Unknown Has patient had a PCN reaction that required hospitalization: Unknown Has patient had a PCN reaction occurring within the last 10 years: No If all of the above answers are "NO", then may proceed with Cephalosporin use.   Marland Kitchen  Sulfa Antibiotics Other (See Comments)    Reaction: unknown    DISCHARGE MEDICATIONS:   Current Discharge Medication List    CONTINUE these medications which have NOT CHANGED   Details  amLODipine (NORVASC) 5 MG tablet Reported on 07/07/2015         DISCHARGE INSTRUCTIONS:    No follow-up  If  you experience worsening of your admission symptoms, develop shortness of breath, life threatening emergency, suicidal or homicidal thoughts you must seek medical attention immediately by calling 911 or calling your MD immediately  if symptoms less severe.  You Must read complete instructions/literature along with all the possible adverse reactions/side effects for all the Medicines you take and that have been prescribed to you. Take any new Medicines after you have completely understood and accept all the possible adverse reactions/side effects.   Please note  You were cared for by a hospitalist during your hospital stay. If you have any questions about your discharge medications or the care you received while you were in the hospital after you are discharged, you can call the unit and asked to speak with the hospitalist on call if the hospitalist that took care of you is not available. Once you are discharged, your primary care physician will handle any further medical issues. Please note that NO REFILLS for any discharge medications will be authorized once you are discharged, as it is imperative that you return to your primary care physician (or establish a relationship with a primary care physician if you do not have one) for your aftercare needs so that they can reassess your need for medications and monitor your lab values.    Today   CHIEF COMPLAINT:   Chief Complaint  Patient presents with  . Abdominal Pain    HISTORY OF PRESENT ILLNESS:     VITAL SIGNS:  Blood pressure 106/60, pulse 100, temperature 98 F (36.7 C), temperature source Axillary, resp. rate 11, height 5\' 5"  (1.651 m), weight 65.8 kg (145 lb 1 oz), SpO2 (!) 82 %.  I/O:   Intake/Output Summary (Last 24 hours) at 07/03/2016 1320 Last data filed at 07/13/2016 0400  Gross per 24 hour  Intake           120.47 ml  Output                0 ml  Net           120.47 ml    PHYSICAL EXAMINATION:  GENERAL:  83 y.o.-year-old  patient lying in the bed with no acute distress.  EYES: Pupils equal, round, reactive to light and accommodation. No scleral icterus. Extraocular muscles intact.  HEENT: Head atraumatic, normocephalic. Oropharynx and nasopharynx clear.  NECK:  Supple, no jugular venous distention. No thyroid enlargement, no tenderness.  LUNGS: Normal breath sounds bilaterally, no wheezing, rales,rhonchi or crepitation. No use of accessory muscles of respiration.  CARDIOVASCULAR: S1, S2 normal. No murmurs, rubs, or gallops.  ABDOMEN: Soft, non-tender, non-distended. Bowel sounds present. No organomegaly or mass.  EXTREMITIES: No pedal edema, cyanosis, or clubbing.  NEUROLOGIC: Cranial nerves II through XII are intact. Muscle strength 5/5 in all extremities. Sensation intact. Gait not checked.  PSYCHIATRIC: The patient is alert and oriented x 3.  SKIN: No obvious rash, lesion, or ulcer.   DATA REVIEW:   CBC  Recent Labs Lab 07/06/16 0503  WBC 11.1*  HGB 11.9*  HCT 35.3  PLT 231    Chemistries   Recent Labs Lab 07/06/16 0503  NA  137  K 4.1  CL 100*  CO2 28  GLUCOSE 110*  BUN 35*  CREATININE 1.22*  CALCIUM 8.4*    Cardiac Enzymes  Recent Labs Lab 07/06/16 0503  TROPONINI 2.66*    Microbiology Results  Results for orders placed or performed during the hospital encounter of 2016-12-07  Urine culture     Status: Abnormal   Collection Time: 2016-12-07  6:33 PM  Result Value Ref Range Status   Specimen Description URINE, RANDOM  Final   Special Requests NONE  Final   Culture MULTIPLE SPECIES PRESENT, SUGGEST RECOLLECTION (A)  Final   Report Status 07/05/2016 FINAL  Final  MRSA PCR Screening     Status: None   Collection Time: 07/04/16  5:26 AM  Result Value Ref Range Status   MRSA by PCR NEGATIVE NEGATIVE Final    Comment:        The GeneXpert MRSA Assay (FDA approved for NASAL specimens only), is one component of a comprehensive MRSA colonization surveillance program. It is  not intended to diagnose MRSA infection nor to guide or monitor treatment for MRSA infections.     RADIOLOGY:  No results found.  EKG:   Orders placed or performed during the hospital encounter of 2016-12-07  . ED EKG  . ED EKG  . EKG 12-Lead  . EKG 12-Lead      Management plans discussed with the patient, family and they are in agreement.  CODE STATUS:     Code Status Orders        Start     Ordered   07/05/16 1153  Do not attempt resuscitation (DNR)  Continuous    Question Answer Comment  In the event of cardiac or respiratory ARREST Do not call a "code blue"   In the event of cardiac or respiratory ARREST Do not perform Intubation, CPR, defibrillation or ACLS   In the event of cardiac or respiratory ARREST Use medication by any route, position, wound care, and other measures to relive pain and suffering. May use oxygen, suction and manual treatment of airway obstruction as needed for comfort.      07/05/16 1152    Code Status History    Date Active Date Inactive Code Status Order ID Comments User Context   07/04/2016  5:25 AM 07/05/2016 11:52 AM Full Code [id number]  Oralia ManisWillis, David, MD Inpatient    Advance Directive Documentation     Most Recent Value  Type of Advance Directive  Healthcare Power of Attorney  Pre-existing out of facility DNR order (yellow form or pink MOST form)  -  "MOST" Form in Place?  -      TOTAL TIME TAKING CARE OF THIS PATIENT: 40  minutes.  Discussed with patient's family, all questions were answered  Alucard Fearnow M.D on 07/10/2016 at 1:20 PM  Between 7am to 6pm - Pager - [phone]  After 6pm go to [email or web address] - password [email or web address] [facility]  [place] [place] [facility]  Office  [phone]  CC: Primary care physician; Corky DownsMasoud, Javed, MD

## 2016-07-14 NOTE — Death Summary Note (Signed)
DEATH SUMMARY   Patient Details  Name: Matsue Strom MRN: [id number] DOB: Jan 21, 1924  Admission/Discharge Information   Admit Date:  07/06/2016  Date of Death: Date of Death: July 14, 2016  Time of Death: Time of Death: 1150  Length of Stay: 7  Referring Physician: Corky Downs, MD     Diagnoses  Preliminary cause of death: Acute MI, subendocardial (HCC) Secondary Diagnoses (including complications and co-morbidities):  Principal Problem:   NSTEMI (non-ST elevated myocardial infarction) (HCC) Active Problems:   HTN (hypertension)   Overactive bladder   Acute pulmonary edema (HCC)   Acute respiratory failure (HCC)   Palliative care by specialist   Goals of care, counseling/discussion   Terminal care   Palliative care encounter   Acute systolic CHF (congestive heart failure) (HCC)   Hyponatremia   Hypotension   Acute renal insufficiency   Leukocytosis   Brief Hospital Course (including significant findings, care, treatment, and services provided and events leading to death)  Sarann Tregre is a 83 y.o. year old female  with past medical history of hypertension, obesity, peripheral vascular disease, who presented to the hospital with complaints of epigastric and right upper quadrant abdominal pain. On arrival to the hospital, she was noted to have elevated troponin. Right upper quadrant ultrasound was unremarkable. Hospitalist services. Admitted patient for non-STEMI. Initial chest x-ray revealed pulmonary edema. Echocardiogram done during this admission showed ejection fraction of 25-30%, mild LVH, diastolic dysfunction. Patient was consulted by critical care physicians, patient's family discussed with physicians patient's care and decided on comfort care measures. Patient was initiated on comfort care measures and expired shortly after.  Discussion by problem:  #1 Acute hypoxemic respiratory failure due to acute CHF. The patient is off high flow oxygen. On O2  [place] for comfort care. nebulizer and antibiotics was discontinued.   #2 Acute systolic CHF. LV EF: 25% - 30%. discontinuedLasix IV.Marland Kitchen  #3 NSTEMI, heparin drip was discontinued. echocardiogram LV EF: 25% - 30%. Supportive care only  #4 HTN (hypertension), now hypotensive, of home meds    Pertinent Labs and Studies  Significant Diagnostic Studies Dg Chest Port 1 View  Result Date: 07/06/2016 CLINICAL DATA:  Respiratory failure. EXAM: PORTABLE CHEST 1 VIEW COMPARISON:  07/05/2016.  CT 07/04/2016. FINDINGS: Heart size stable. Interim partial clearing of bilateral perihilar and upper lobe infiltrates. New right base atelectasis/ infiltrate. Stable left base atelectasis/ infiltrate. New small right pleural effusion. No pneumothorax. Stable cardiomegaly. IMPRESSION: 1. Interim partial clearing of bilateral perihilar and upper lobe infiltrates. Persistent left base atelectasis/ infiltrate. 2. New mild right base atelectasis/ infiltrate and small right pleural effusion. Findings new from prior exam. Electronically Signed   By: Maisie Fus  Register   On: 07/06/2016 06:36   Dg Chest Port 1 View  Result Date: 07/05/2016 CLINICAL DATA:  Respiratory failure. EXAM: PORTABLE CHEST 1 VIEW COMPARISON:  07/04/2016.  07/06/16.  CT 07/04/2016. FINDINGS: Mediastinum stable. Heart size stable. Persistent bilateral perihilar, bilateral upper lobe, left lower lobe infiltrates/edema. Slight improvement from prior exam. No pleural effusion or pneumothorax. Heart size stable. IMPRESSION: Persistent bilateral perihilar, bilateral upper lobe, left lower lobe infiltrate/edema. Slight improvement from prior exam. Electronically Signed   By: Maisie Fus  Register   On: 07/05/2016 06:23   Dg Chest Port 1 View  Result Date: 07/04/2016 CLINICAL DATA:  Respiratory failure and abdominal pain. History of hypertension. EXAM: PORTABLE CHEST 1 VIEW COMPARISON:  Five hundred twenty 1,018 FINDINGS: Shallow inspiration. Cardiac  enlargement. Bilateral perihilar airspace infiltrates, developing since previous study.  Blunting of the left costophrenic angle suggesting a small effusion. No pneumothorax. Calcification of the aorta. IMPRESSION: Developing perihilar infiltrates since previous study. Small left pleural effusion. Electronically Signed   By: Burman NievesWilliam  Stevens M.D.   On: 07/04/2016 06:53   Dg Chest Portable 1 View  Result Date: 07/07/2016 CLINICAL DATA:  Epigastric pain and shortness of breath EXAM: PORTABLE CHEST 1 VIEW COMPARISON:  04/24/2013 FINDINGS: Shallow inspiration with elevation of the right hemidiaphragm. Blunting of the left costophrenic angle suggesting a small effusion. No consolidation or airspace disease in the lungs. No pneumothorax. Mild cardiac enlargement with normal pulmonary vascularity. Right hilum is somewhat prominent but this is similar to previous study and likely represents prominent vascularity. Calcification and torsion of the aorta. IMPRESSION: Shallow inspiration. Cardiac enlargement. No evidence of active pulmonary disease. Electronically Signed   By: Burman NievesWilliam  Stevens M.D.   On: 07/07/2016 23:49   Ct Angio Chest/abd/pel For Dissection W And/or Wo Contrast  Result Date: 07/04/2016 CLINICAL DATA:  Chest and abdomen pain. Right upper quadrant and epigastric pain radiating to the back. Started last night. Gallbladder spasms. Nausea. Difficulty breathing. EXAM: CT ANGIOGRAPHY CHEST, ABDOMEN AND PELVIS TECHNIQUE: Multidetector CT imaging through the chest, abdomen and pelvis was performed using the standard protocol during bolus administration of intravenous contrast. Multiplanar reconstructed images and MIPs were obtained and reviewed to evaluate the vascular anatomy. CONTRAST:  100 mL Isovue 370 COMPARISON:  CT abdomen and pelvis 04/25/2013 FINDINGS: CTA CHEST FINDINGS Cardiovascular: Unenhanced images of the aorta demonstrates scattered calcifications. No evidence of intramural hematoma. Coronary  artery calcifications are present. Gas within venous structures likely arises from intravenous injections. Images obtained during arterial phase after contrast injection demonstrate good opacification of the thoracic aorta. No aortic aneurysm or dissection. Great vessel origins are patent. Central pulmonary arteries are well opacified. No evidence for significant pulmonary embolus. Normal heart size. No pericardial effusion. Mediastinum/Nodes: No significant lymphadenopathy in the chest. Esophagus is decompressed. Lungs/Pleura: Evaluation of lungs is limited due to respiratory motion artifact. There is diffuse consolidation throughout the lungs. This may be due to multifocal pneumonia, edema, or ARDS. Airways are patent. Small bilateral pleural effusions. No pneumothorax. Musculoskeletal: Degenerative changes in the spine. No destructive bone lesions. Review of the MIP images confirms the above findings. CTA ABDOMEN AND PELVIS FINDINGS VASCULAR Aorta: Diffuse vascular calcifications. Normal caliber abdominal aorta. No evidence of aneurysm or dissection. No significant stenosis. Celiac: Patent without evidence of aneurysm, dissection, vasculitis or significant stenosis. SMA: Patent without evidence of aneurysm, dissection, vasculitis or significant stenosis. Renals: Both renal arteries are patent without evidence of aneurysm, dissection, vasculitis, fibromuscular dysplasia or significant stenosis. IMA: Patent without evidence of aneurysm, dissection, vasculitis or significant stenosis. Inflow: Patent without evidence of aneurysm, dissection, vasculitis or significant stenosis. Veins: No obvious venous abnormality within the limitations of this arterial phase study. Review of the MIP images confirms the above findings. NON-VASCULAR Hepatobiliary: Multiple low-attenuation lesions throughout the liver. The largest measures about 4 cm maximal diameter. Characterization of these lesions is difficult due to earlier  arterial phase and motion artifact. Lesions likely represent cysts. No change since previous study. Gallbladder is contracted. No bile duct dilatation. Pancreas: Unremarkable. No pancreatic ductal dilatation or surrounding inflammatory changes. Spleen: Normal in size without focal abnormality. Adrenals/Urinary Tract: Adrenal glands are unremarkable. Kidneys are normal, without renal calculi, focal lesion, or hydronephrosis. Bladder is unremarkable.PE Stomach/Bowel: Stomach, small bowel, and colon are not abnormally distended. Scattered diverticula in the colon. No evidence of diverticulitis. Appendix  is not identified. Lymphatic: No significant lymphadenopathy. Reproductive: Status post hysterectomy. No adnexal masses. Other: No free air or free fluid is identified in the abdomen. Abdominal wall musculature appears intact. Examination is limited due to motion artifact. Musculoskeletal: Degenerative changes throughout the spine. No destructive bone lesions. Review of the MIP images confirms the above findings. IMPRESSION: Examination is technically limited due to motion artifact. Diffuse vascular calcification. No evidence of aneurysm or dissection of the thoracic or abdominal aorta. Major vessels appear patent. Diffuse airspace consolidation throughout both lungs with small pleural effusions. This could be due to multifocal pneumonia, edema, or ARDS. No acute process demonstrated in the abdomen or pelvis. Electronically Signed   By: Burman Nieves M.D.   On: 07/04/2016 04:16   US Abdomen Limited Ruq  Result Date: 07/04/2016 CLINICAL DATA:  Acute onset of generalized abdominal pain. Initial encounter. EXAM: US ABDOMEN LIMITED - RIGHT UPPER QUADRANT COMPARISON:  CT abdomen and pelvis performed 04/25/2013 FINDINGS: Gallbladder: No gallstones or wall thickening visualized. No sonographic Murphy sign noted by sonographer. Common bile duct: Diameter: 0.2 cm, within normal limits in caliber. Liver: Multiple hepatic  cysts are again noted, measuring up to 3.7 cm in size. Within normal limits in parenchymal echogenicity. A small right pleural effusion is noted. IMPRESSION: 1. No acute abnormality at the right upper quadrant. 2. Small right pleural effusion noted. 3. Multiple hepatic cysts again noted. Electronically Signed   By: Roanna Raider M.D.   On: 07/04/2016 00:51    Microbiology Recent Results (from the past 240 hour(s))  Urine culture     Status: Abnormal   Collection Time: 07/02/2016  6:33 PM  Result Value Ref Range Status   Specimen Description URINE, RANDOM  Final   Special Requests NONE  Final   Culture MULTIPLE SPECIES PRESENT, SUGGEST RECOLLECTION (A)  Final   Report Status 07/05/2016 FINAL  Final  MRSA PCR Screening     Status: None   Collection Time: 07/04/16  5:26 AM  Result Value Ref Range Status   MRSA by PCR NEGATIVE NEGATIVE Final    Comment:        The GeneXpert MRSA Assay (FDA approved for NASAL specimens only), is one component of a comprehensive MRSA colonization surveillance program. It is not intended to diagnose MRSA infection nor to guide or monitor treatment for MRSA infections.     Lab Basic Metabolic Panel:  Recent Labs Lab 07/05/16 0429 07/06/16 0503  NA 135 137  K 3.9 4.1  CL 102 100*  CO2 24 28  GLUCOSE 89 110*  BUN 27* 35*  CREATININE 1.20* 1.22*  CALCIUM 8.3* 8.4*   Liver Function Tests: No results for input(s): AST, ALT, ALKPHOS, BILITOT, PROT, ALBUMIN in the last 168 hours. No results for input(s): LIPASE, AMYLASE in the last 168 hours. No results for input(s): AMMONIA in the last 168 hours. CBC:  Recent Labs Lab 07/05/16 0429 07/06/16 0503  WBC 11.5* 11.1*  HGB 12.7 11.9*  HCT 37.8 35.3  MCV 91.4 90.5  PLT 220 231   Cardiac Enzymes:  Recent Labs Lab 07/04/16 1408 07/05/16 0429 07/06/16 0503  TROPONINI 3.17* 2.03* 2.66*   Sepsis Labs:  Recent Labs Lab 07/05/16 0429 07/06/16 0503  PROCALCITON 0.28  --   WBC 11.5*  11.1*    Procedures/Operations   Time spent 40 minutes  Daimion Adamcik 2016/07/20, 1:27 PM

## 2016-07-14 NOTE — Progress Notes (Signed)
Daily Progress Note   Patient Name: Tiffany Jenkins       Date: 06/14/2016 DOB: 07/29/1923  Age: 83 y.o. MRN#: [id number] Attending Physician: Katharina CaperVaickute, Rima, MD Primary Care Physician: Corky DownsMasoud, Javed, MD Admit Date: 08-02-2016  Reason for Consultation/Follow-up: Establishing goals of care and Terminal Care  Subjective: Patient lethargic but comfortable. No signs or symptoms of distress. Shallow respirations with periods of apnea. Morphine infusion.   Multiple family members at bedside. Discussed EOL expectations. Answered questions and concerns. Family shares stories of their mother. Provided emotional and spiritual support.       Length of Stay: 7  Current Medications: Scheduled Meds:  . LORazepam  0.5 mg Intravenous BID    Continuous Infusions: . morphine 4 mg/hr (07/10/16 1810)    PRN Meds: lip balm, LORazepam, meclizine, morphine injection  Physical Exam  Constitutional: She appears ill.  lethargic  HENT:  Head: Normocephalic and atraumatic.  Cardiovascular: Normal heart sounds.   Pulmonary/Chest: No accessory muscle usage. No tachypnea. No respiratory distress. She has decreased breath sounds.  Skin: Skin is warm and dry. There is pallor.  Nursing note and vitals reviewed.          Vital Signs: BP 106/60 (BP Location: Right Arm)   Pulse 100   Temp 98 F (36.7 C) (Axillary)   Resp 11   Ht 5\' 5"  (1.651 m)   Wt 65.8 kg (145 lb 1 oz)   SpO2 (!) 82%   BMI 24.14 kg/m  SpO2: SpO2: (!) 82 % O2 Device: O2 Device: Not Delivered O2 Flow Rate: O2 Flow Rate (L/min): 2 L/min  Intake/output summary:  Intake/Output Summary (Last 24 hours) at 06/27/2016 1010 Last data filed at 06/18/2016 0400  Gross per 24 hour  Intake           120.47 ml  Output                0 ml    Net           120.47 ml   LBM: Last BM Date: 07/10/16 Baseline Weight: Weight: 63.5 kg (140 lb) Most recent weight: Weight: 65.8 kg (145 lb 1 oz)       Palliative Assessment/Data: PPS 10%   Flowsheet Rows     Most Recent Value  Intake Tab  Referral Department  Critical care  Unit at Time of Referral  ICU  Palliative Care Primary Diagnosis  Cardiac  Date Notified  07/06/16  Palliative Care Type  New Palliative care  Reason for referral  Clarify Goals of Care, Counsel Regarding Hospice  Date of Admission  07-27-16  Date first seen by Palliative Care  07/06/16  # of days Palliative referral response time  0 Day(s)  # of days IP prior to Palliative referral  3  Clinical Assessment  Palliative Performance Scale Score  10%  Psychosocial & Spiritual Assessment  Palliative Care Outcomes  Patient/Family meeting held?  Yes  Who was at the meeting?  patient, husband, and multiple family members  Palliative Care Outcomes  Clarified goals of care, Counseled regarding hospice, Provided psychosocial or spiritual support, Provided end of life care assistance, Improved pain interventions      Patient Active Problem List   Diagnosis Date Noted  . Terminal care   . Acute pulmonary edema (HCC)   . Acute respiratory failure (HCC)   . Palliative care by specialist   . Goals of care, counseling/discussion   . NSTEMI (non-ST elevated myocardial infarction) (HCC) 07/04/2016  . HTN (hypertension) 07/04/2016  . Overactive bladder 07/04/2016  . Recurrent UTI 06/27/2015  . Atrophic vaginitis 06/27/2015  . Incontinence 06/27/2015    Palliative Care Assessment & Plan   Patient Profile: 83 y.o. female  with past medical history of urinary incontinence, dysphagia secondary to esophageal strictures, hypertension, degenerative disc disease, candidiasis of breast, dizziness, and atherosclerosis admitted on 27-Jul-2016 with right upper quadrant abdominal pain with radiation to back. In ED, troponin of  2.2, NSTEMI, and with acute hypoxic respiratory distress requiring BiPAP. CT angio reveals multifocal pneumonia, pulmonary edema, or ARDS. US abdomen negative for acute abnormality. ECHO on 5/22 with EF of 20-25%, severe LV dysfunction, dilated LA and moderate pulmonary hypertension. DNR. Not a candidate for invasive cardiology workup. Palliative medicine consultation for goals of care/hospice evaluation. Comfort care on 5/26.  Assessment: NSTEMI Acute hypoxemic respiratory failure  Acute systolic CHF HTN  Recommendations/Plan:  DNR/DNI  Comfort measures only  Continue morphine infusion. PRN medications for comfort.   Patient transitioning with long periods of apnea--likely hospital death soon.  Goals of Care and Additional Recommendations:  Limitations on Scope of Treatment: Full Comfort Care  Code Status: DNR/DNI   Code Status Orders        Start     Ordered   07/05/16 1153  Do not attempt resuscitation (DNR)  Continuous    Question Answer Comment  In the event of cardiac or respiratory ARREST Do not call a "code blue"   In the event of cardiac or respiratory ARREST Do not perform Intubation, CPR, defibrillation or ACLS   In the event of cardiac or respiratory ARREST Use medication by any route, position, wound care, and other measures to relive pain and suffering. May use oxygen, suction and manual treatment of airway obstruction as needed for comfort.      07/05/16 1152    Code Status History    Date Active Date Inactive Code Status Order ID Comments User Context   07/04/2016  5:25 AM 07/05/2016 11:52 AM Full Code [id number]  Oralia Manis, MD Inpatient    Advance Directive Documentation     Most Recent Value  Type of Advance Directive  Healthcare Power of Attorney  Pre-existing out of facility DNR order (yellow form or pink MOST form)  -  "MOST" Form in Place?  -  Prognosis:   Hours - Days  Discharge Planning:  Anticipated Hospital Death  Care plan  was discussed with multiple family members at bedside including two daughters   Thank you for allowing the Palliative Medicine Team to assist in the care of this patient.   Time In: 0945 Time Out: 1020 Total Time Prolonged Time Billed no      Greater than 50%  of this time was spent counseling and coordinating care related to the above assessment and plan.  Vennie Homans, FNP-C Palliative Medicine Team  Phone: [phone] Fax: [phone]  Please contact Palliative Medicine Team phone at [phone] for questions and concerns.

## 2016-07-14 NOTE — Progress Notes (Signed)
This nurse called to room by patient's daughter Andrey CampanileSandy.  Patient lying in bed with no pulse or respirations.  Second nurse Skyler verified the same.  Time of death 1150.  Ann, nursing supervisor and Dr. Winona LegatoVaickute made aware.  COPA made aware and patient is a possible tissue donor, reference number [id number].  Patient's husband is a patient on 1A and family is bring him over to see patient.  Orson Apeanielle Danney Bungert, RN

## 2016-07-14 DEATH — deceased

## 2017-01-25 DIAGNOSIS — R7989 Other specified abnormal findings of blood chemistry: Secondary | ICD-10-CM

## 2017-01-25 DIAGNOSIS — R778 Other specified abnormalities of plasma proteins: Secondary | ICD-10-CM

## 2018-01-29 IMAGING — CT CT ANGIO CHEST-ABD-PELV FOR DISSECTION W/ AND WO/W CM
2 of 7 series · 11 of 36 positions shown, 15 images · IV contrast (APPLIED)
Comparison: CT abdomen and pelvis 04/25/2013

CLINICAL DATA: Chest and abdomen pain. Right upper quadrant and
epigastric pain radiating to the back. Started last night.
Gallbladder spasms. Nausea. Difficulty breathing.

EXAM:
CT ANGIOGRAPHY CHEST, ABDOMEN AND PELVIS
TECHNIQUE: Multidetector CT imaging through the chest, abdomen and pelvis was
performed using the standard protocol during bolus administration of
intravenous contrast. Multiplanar reconstructed images and MIPs were
obtained and reviewed to evaluate the vascular anatomy.
CONTRAST:  100 mL Isovue 370

[Series 5: axial arterial · axial · arterial · 0.73mm/px · z∈[-481,+23]mm · 10 of 198 slices shown, 13 images]
[im 15/198  mediastinal]
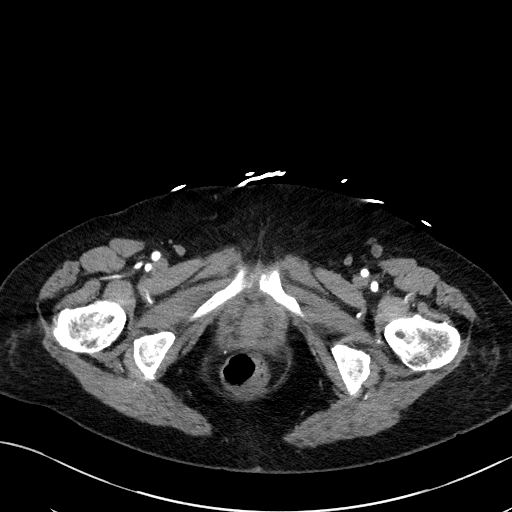
[im 15/198  bone]
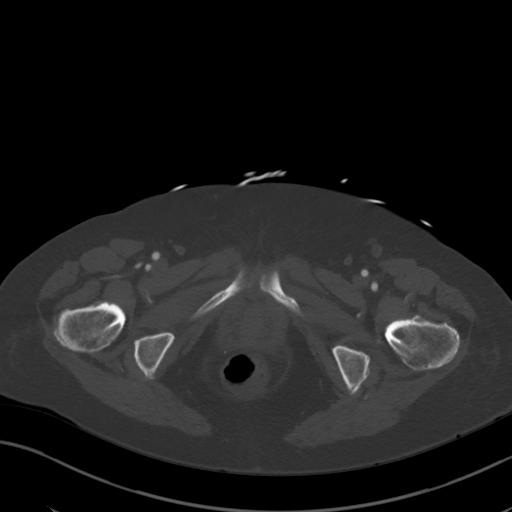
[im 43/198  mediastinal]
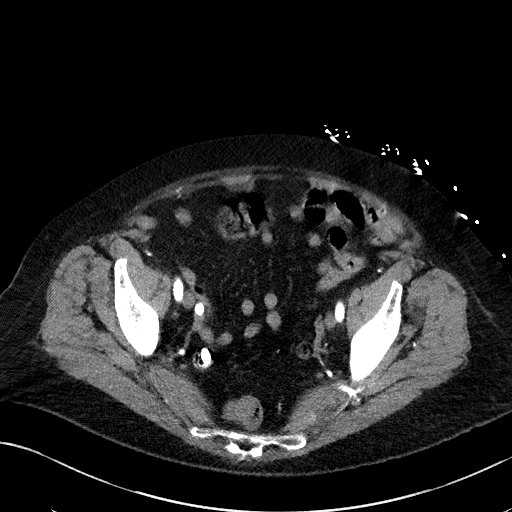
[im 71/198  mediastinal]
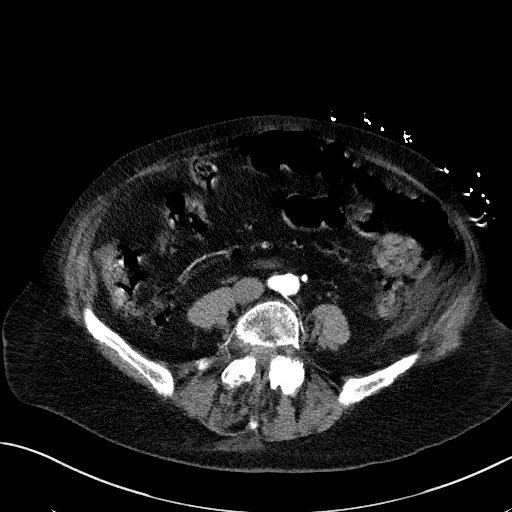
[im 85/198  mediastinal]
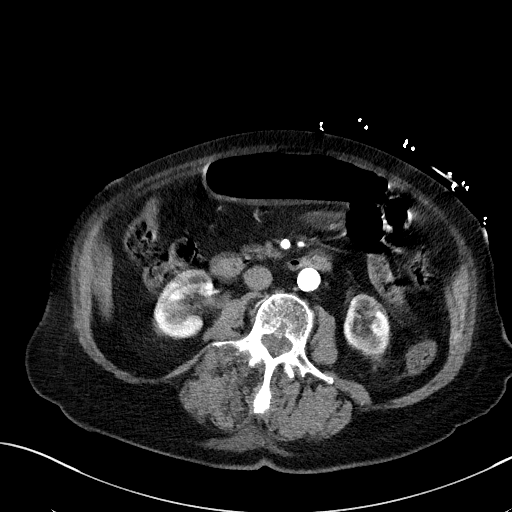
[im 113/198  mediastinal]
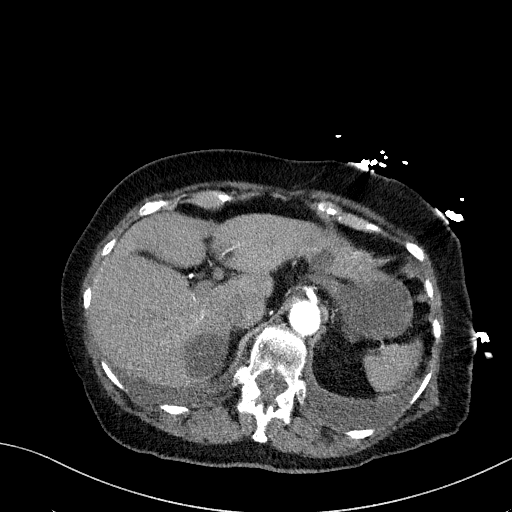
[im 127/198  mediastinal]
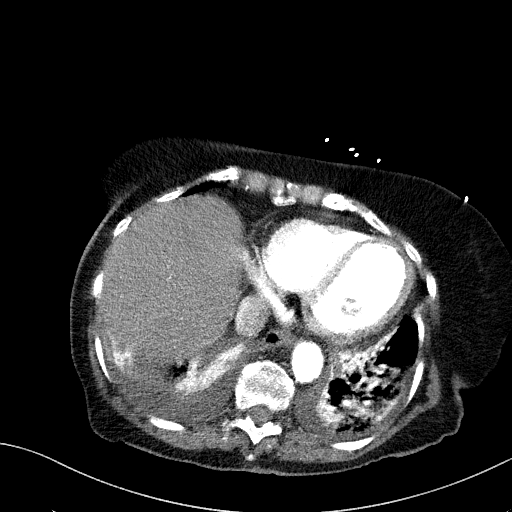
[im 141/198  lung]
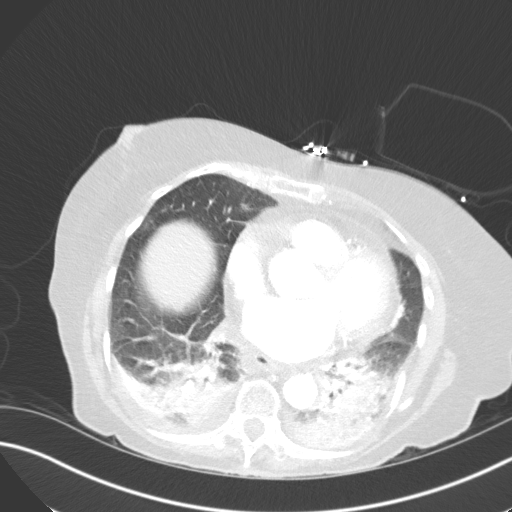
[im 155/198  mediastinal]
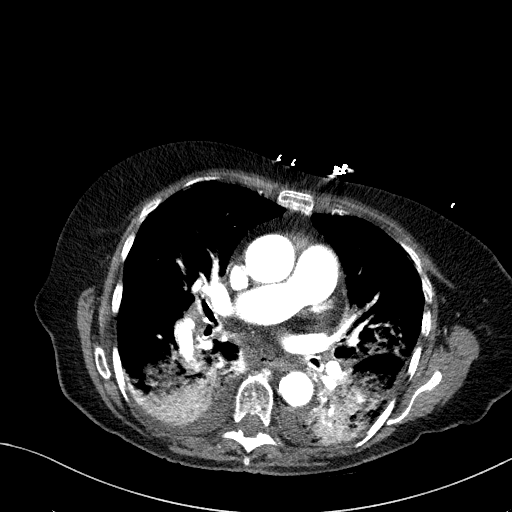
[im 155/198  lung]
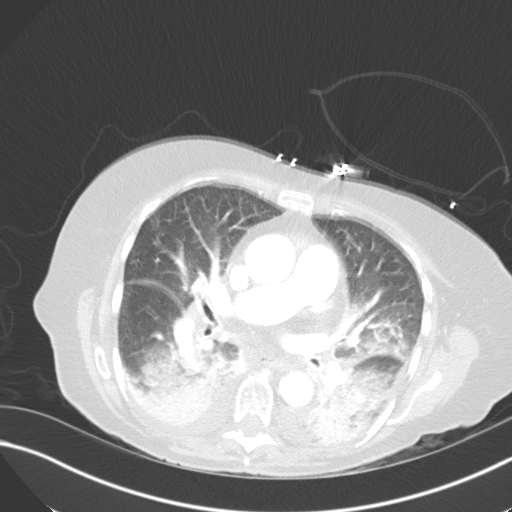
[im 169/198  lung]
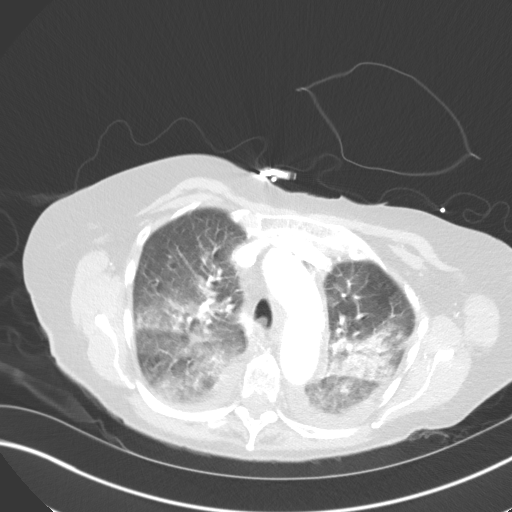
[im 183/198  mediastinal]
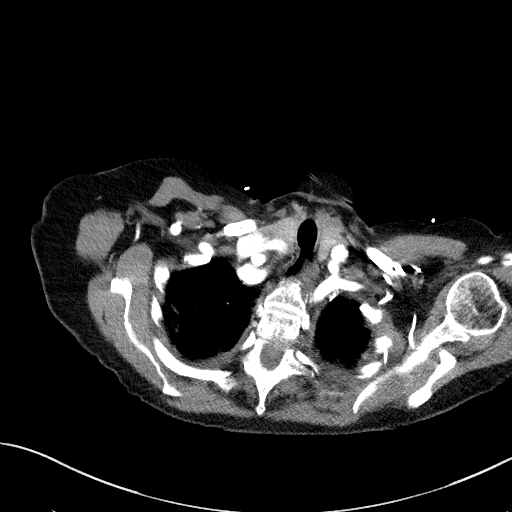
[im 183/198  lung]
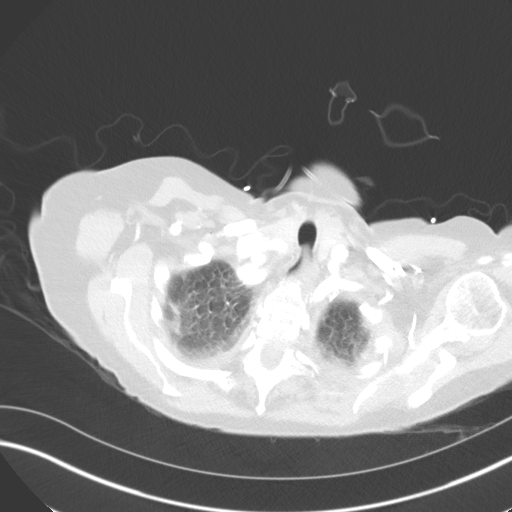

[Series 8: coronals · coronal · 0.76mm/px · 1 of 121 slices shown, 2 images]
[im 61/121  mediastinal]
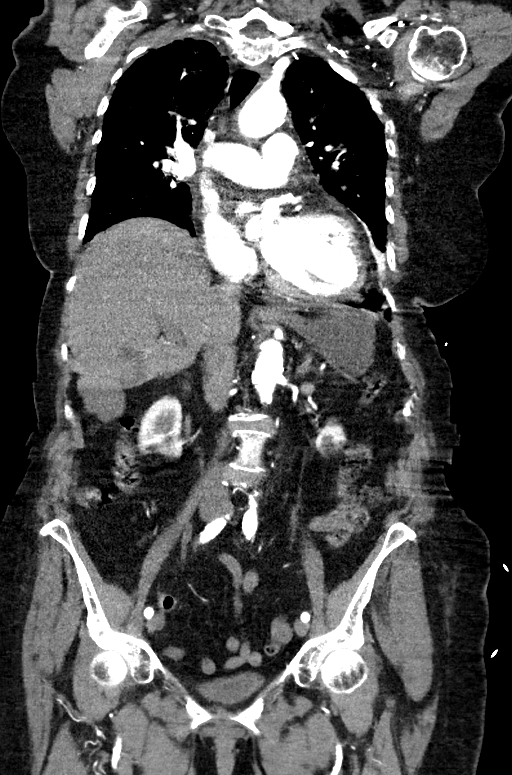
[im 61/121  bone]
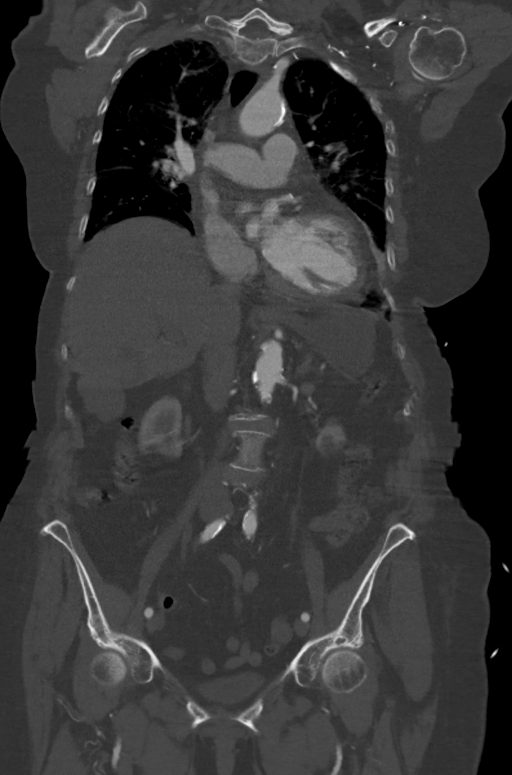

[11 of 36 positions shown; findings below may reference images not displayed]

FINDINGS: CTA CHEST FINDINGS

Cardiovascular: Unenhanced images of the aorta demonstrates
scattered calcifications. No evidence of intramural hematoma.
Coronary artery calcifications are present. Gas within venous
structures likely arises from intravenous injections.

Images obtained during arterial phase after contrast injection
demonstrate good opacification of the thoracic aorta. No aortic
aneurysm or dissection. Great vessel origins are patent. Central
pulmonary arteries are well opacified. No evidence for significant
pulmonary embolus. Normal heart size. No pericardial effusion.

Mediastinum/Nodes: No significant lymphadenopathy in the chest.
Esophagus is decompressed.

Lungs/Pleura: Evaluation of lungs is limited due to respiratory
motion artifact. There is diffuse consolidation throughout the
lungs. This may be due to multifocal pneumonia, edema, or ARDS.
Airways are patent. Small bilateral pleural effusions. No
pneumothorax.

Musculoskeletal: Degenerative changes in the spine. No destructive
bone lesions.

Review of the MIP images confirms the above findings.

CTA ABDOMEN AND PELVIS FINDINGS

VASCULAR

Aorta: Diffuse vascular calcifications. Normal caliber abdominal
aorta. No evidence of aneurysm or dissection. No significant
stenosis.

Celiac: Patent without evidence of aneurysm, dissection, vasculitis
or significant stenosis.

SMA: Patent without evidence of aneurysm, dissection, vasculitis or
significant stenosis.

Renals: Both renal arteries are patent without evidence of aneurysm,
dissection, vasculitis, fibromuscular dysplasia or significant
stenosis.

IMA: Patent without evidence of aneurysm, dissection, vasculitis or
significant stenosis.

Inflow: Patent without evidence of aneurysm, dissection, vasculitis
or significant stenosis.

Veins: No obvious venous abnormality within the limitations of this
arterial phase study.

Review of the MIP images confirms the above findings.

NON-VASCULAR

Hepatobiliary: Multiple low-attenuation lesions throughout the
liver. The largest measures about 4 cm maximal diameter.
Characterization of these lesions is difficult due to earlier
arterial phase and motion artifact. Lesions likely represent cysts.
No change since previous study. Gallbladder is contracted. No bile
duct dilatation.

Pancreas: Unremarkable. No pancreatic ductal dilatation or
surrounding inflammatory changes.

Spleen: Normal in size without focal abnormality.

Adrenals/Urinary Tract: Adrenal glands are unremarkable. Kidneys are
normal, without renal calculi, focal lesion, or hydronephrosis.
Bladder is unremarkable.PE

Stomach/Bowel: Stomach, small bowel, and colon are not abnormally
distended. Scattered diverticula in the colon. No evidence of
diverticulitis. Appendix is not identified.

Lymphatic: No significant lymphadenopathy.

Reproductive: Status post hysterectomy. No adnexal masses.

Other: No free air or free fluid is identified in the abdomen.
Abdominal wall musculature appears intact. Examination is limited
due to motion artifact.

Musculoskeletal: Degenerative changes throughout the spine. No
destructive bone lesions.

Review of the MIP images confirms the above findings.
IMPRESSION: Examination is technically limited due to motion artifact. Diffuse
vascular calcification. No evidence of aneurysm or dissection of the
thoracic or abdominal aorta. Major vessels appear patent.

Diffuse airspace consolidation throughout both lungs with small
pleural effusions. This could be due to multifocal pneumonia, edema,
or ARDS. No acute process demonstrated in the abdomen or pelvis.

## 2018-01-30 IMAGING — DX DG CHEST 1V PORT
1 series · 1 of 1 positions shown · non-contrast
Comparison: 07/04/2016.  07/03/2016.  CT 07/04/2016.

CLINICAL DATA: Respiratory failure.

EXAM:
PORTABLE CHEST 1 VIEW

[chest ap]
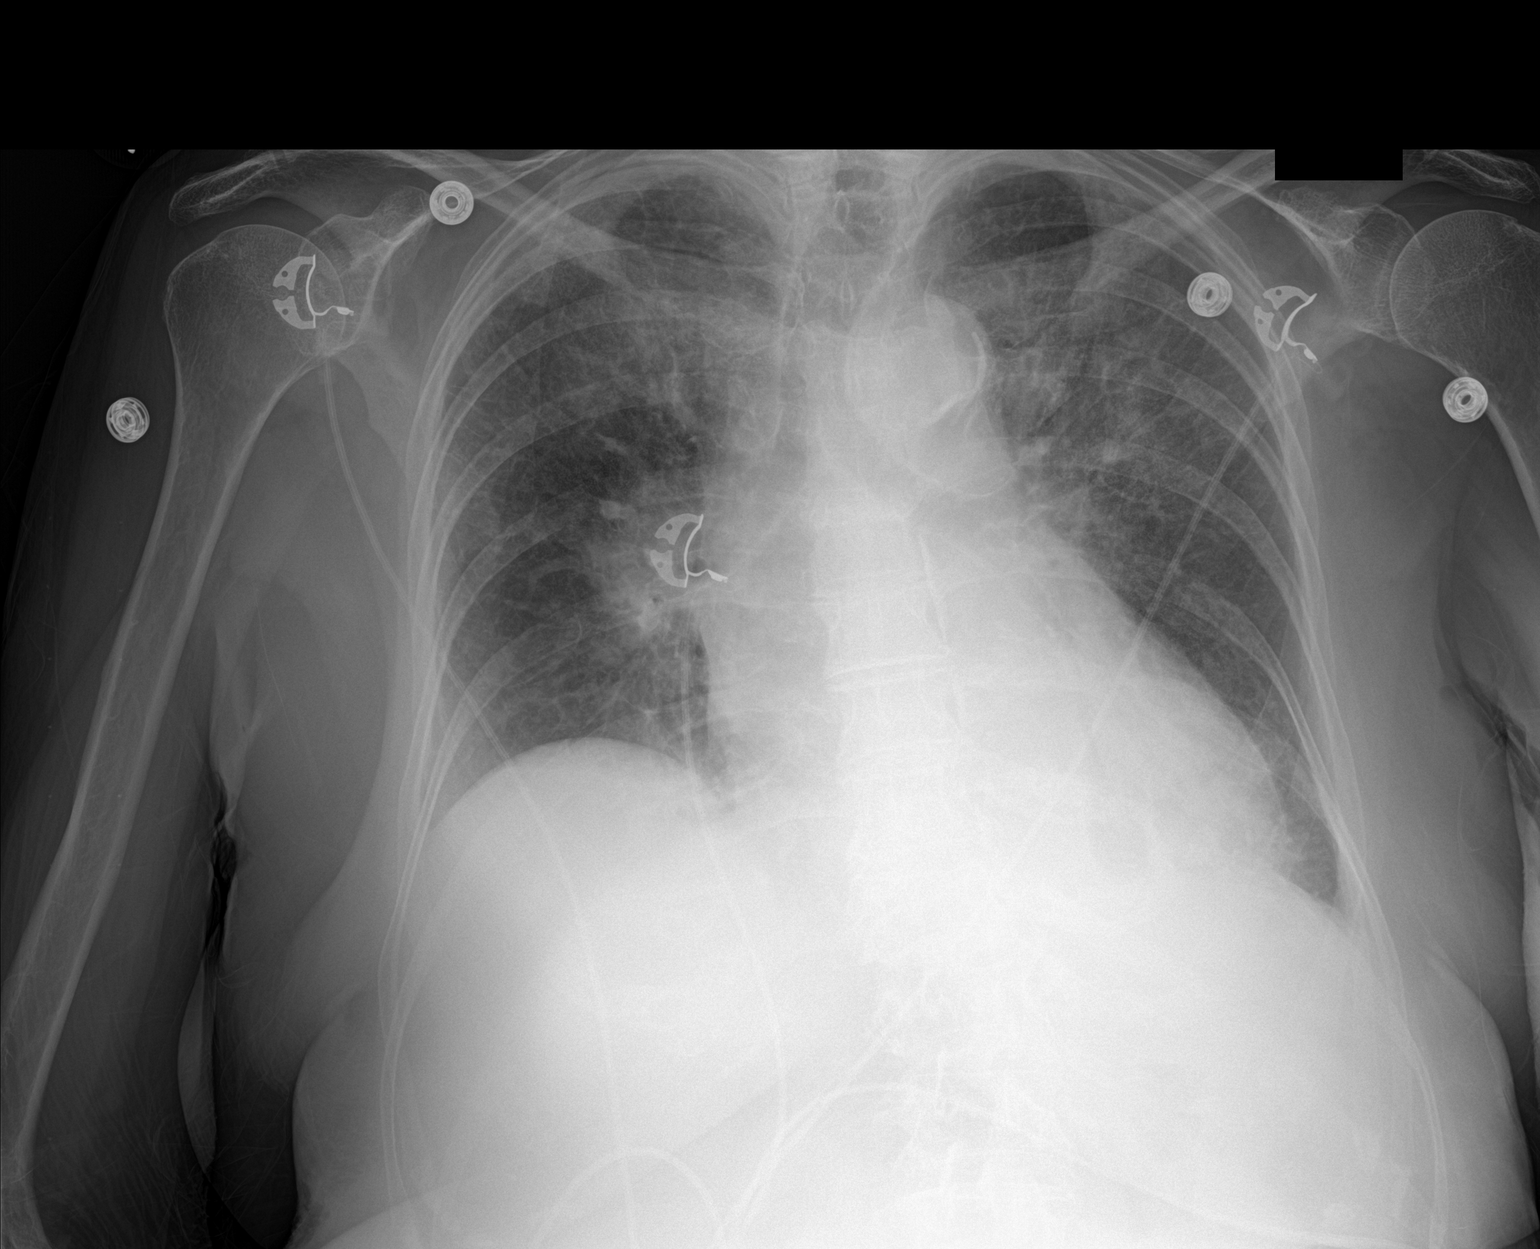

[1 of 1 positions shown; findings below may reference images not displayed]

FINDINGS: Mediastinum stable. Heart size stable. Persistent bilateral
perihilar, bilateral upper lobe, left lower lobe infiltrates/edema.
Slight improvement from prior exam. No pleural effusion or
pneumothorax. Heart size stable.
IMPRESSION: Persistent bilateral perihilar, bilateral upper lobe, left lower
lobe infiltrate/edema. Slight improvement from prior exam.

## 2018-01-31 IMAGING — DX DG CHEST 1V PORT
1 series · 1 of 1 positions shown · non-contrast
Comparison: 07/05/2016.  CT 07/04/2016.

CLINICAL DATA: Respiratory failure.

EXAM:
PORTABLE CHEST 1 VIEW

[chest ap]
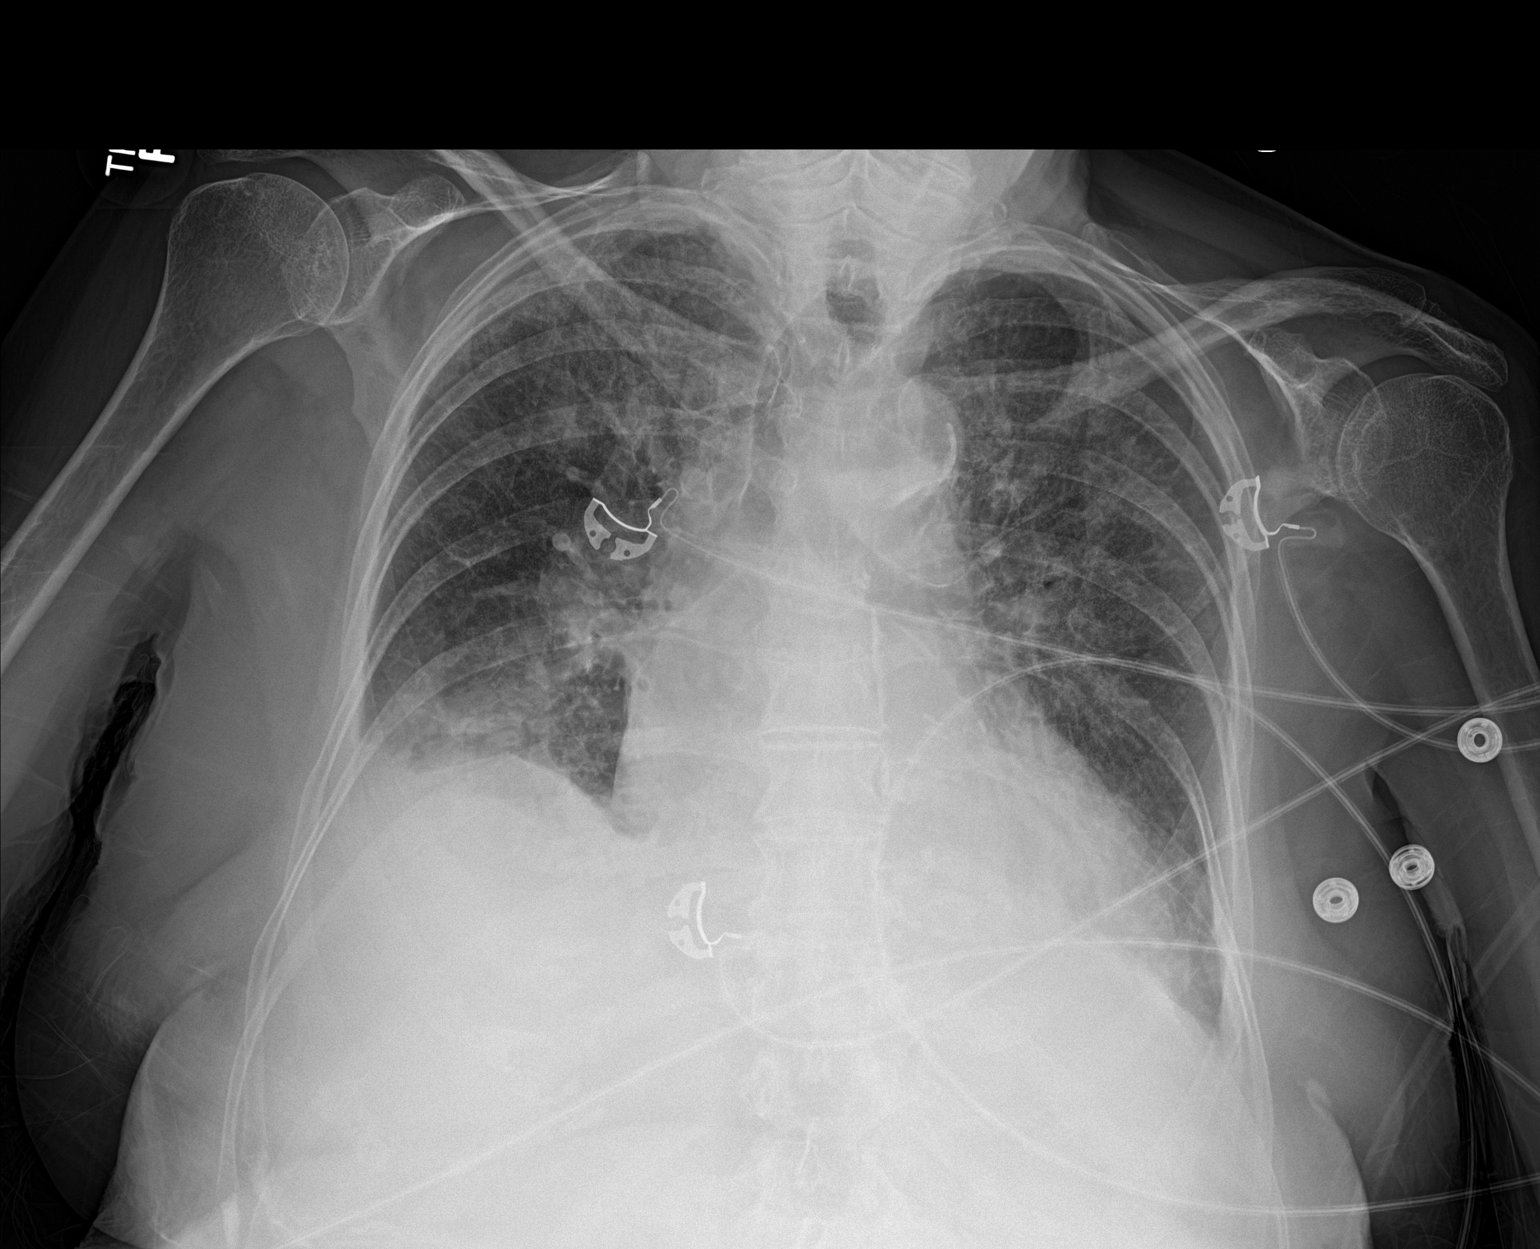

[1 of 1 positions shown; findings below may reference images not displayed]

FINDINGS: Heart size stable. Interim partial clearing of bilateral perihilar
and upper lobe infiltrates. New right base atelectasis/ infiltrate.
Stable left base atelectasis/ infiltrate. New small right pleural
effusion. No pneumothorax. Stable cardiomegaly.
IMPRESSION: 1. Interim partial clearing of bilateral perihilar and upper lobe
infiltrates. Persistent left base atelectasis/ infiltrate.

2. New mild right base atelectasis/ infiltrate and small right
pleural effusion. Findings new from prior exam.
